# Patient Record
Sex: Female | Born: 1985 | Race: White | Hispanic: No | Marital: Married | Smoking: Never smoker
Health system: Southern US, Community
[De-identification: ages and names within clinical notes are randomized; demographics above are authoritative.]

## PROBLEM LIST (undated history)

## (undated) DIAGNOSIS — G43909 Migraine, unspecified, not intractable, without status migrainosus: Secondary | ICD-10-CM

## (undated) DIAGNOSIS — R519 Headache, unspecified: Secondary | ICD-10-CM

## (undated) DIAGNOSIS — H4710 Unspecified papilledema: Secondary | ICD-10-CM

## (undated) DIAGNOSIS — R51 Headache: Secondary | ICD-10-CM

## (undated) DIAGNOSIS — J45909 Unspecified asthma, uncomplicated: Secondary | ICD-10-CM

## (undated) HISTORY — DX: Migraine, unspecified, not intractable, without status migrainosus: G43.909

## (undated) HISTORY — DX: Unspecified asthma, uncomplicated: J45.909

## (undated) HISTORY — DX: Headache, unspecified: R51.9

## (undated) HISTORY — DX: Unspecified papilledema: H47.10

## (undated) HISTORY — DX: Headache: R51

---
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
## 2009-09-24 ENCOUNTER — Ambulatory Visit: Payer: Self-pay | Admitting: Family

## 2009-09-24 ENCOUNTER — Other Ambulatory Visit: Admission: RE | Admit: 2009-09-24 | Discharge: 2009-09-24 | Payer: Self-pay | Admitting: Internal Medicine

## 2009-09-27 ENCOUNTER — Encounter: Payer: Self-pay | Admitting: Family

## 2009-10-22 ENCOUNTER — Ambulatory Visit: Payer: Self-pay | Admitting: Family

## 2009-12-19 ENCOUNTER — Ambulatory Visit: Payer: Self-pay | Admitting: Family

## 2010-04-11 NOTE — Assessment & Plan Note (Signed)
Summary: TO EST/BIRTH CONTROL/HEA--Rm 4   Vital Signs:  Patient profile:   25 year old female LMP:     09/18/2009 Height:      63 inches Weight:      117 pounds BMI:     20.80 Temp:     97.7 degrees F oral Pulse rate:   72 / minute Pulse rhythm:   regular Resp:     16 per minute BP sitting:   104 / 70  (right arm) Cuff size:   regular  Vitals Entered By: Mervin Kung CMA Duncan Dull) (September 24, 2009 9:33 AM) Is Patient Diabetic? No LMP (date): 09/18/2009     Enter LMP: 09/18/2009   History of Present Illness: Christina Harmon is a 25 year old female who presents today to establish care.  She has not seen a primary care since the age of 65.    Preventative-  Walks daily in her neighborhood.  Diet is generally healthy.  Does not have her shot records.   + sexually active,  one partner in the last 6 months.  Currently using condoms.  Never had a pap smear.  Periods are regular.  Preventive Screening-Counseling & Management  Alcohol-Tobacco     Smoking Status: never  Caffeine-Diet-Exercise     Does Patient Exercise: yes     Type of exercise: walking, running, push up     Exercise (avg: min/session): 30-60     Times/week: <3      Drug Use:  no.    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   Never Smoked Alcohol use-no Drug use-no Regular exercise-yes Does Patient Exercise:  yes Smoking Status:  never Drug Use:  no  Review of Systems       Constitutional: Denies Fever ENT:  Denies nasal congestion or sore throat. Resp: Denies cough CV:  Denies  Chest Pain GI:  Denies nausea or vomitting GU: Denies dysuria Lymphatic: Denies lymphadenopathy Musculoskeletal:  Denies muscle/joint pain Skin:  Denies Rashes Psychiatric: Denies depression Neuro: Denies numbness     Physical Exam  General:  Well-developed,well-nourished,in no acute distress; alert,appropriate and cooperative throughout examination Head:  Normocephalic and atraumatic without obvious abnormalities. No apparent alopecia or balding. Eyes:  PERRLA Ears:  External ear exam shows no significant lesions or deformities.  Otoscopic examination reveals clear canals, tympanic membranes are intact bilaterally without bulging, retraction, inflammation or discharge. Hearing is grossly normal bilaterally. Neck:  No deformities, masses, or tenderness noted. Lungs:  Normal respiratory effort, chest expands symmetrically. Lungs are clear to auscultation, no crackles or wheezes. Heart:  Normal rate and regular rhythm. S1 and S2 normal without gallop, murmur, click, rub or other extra sounds. Abdomen:  Bowel sounds positive,abdomen soft and non-tender without masses, organomegaly or hernias noted. Genitalia:  Pelvic Exam:        External: normal female genitalia without lesions or masses        Vagina: normal without lesions or masses        Cervix: normal without lesions or masses        Adnexa: normal bimanual exam without masses or fullness  Uterus: normal by palpation        Pap smear: performed Msk:  no joint swelling.   Extremities:  No clubbing, cyanosis, edema, or deformity noted with normal full range of motion of all joints.   Neurologic:  No cranial nerve deficits noted. Station and gait are normal. Plantar reflexes are down-going bilaterally. DTRs are symmetrical throughout. Sensory, motor and coordinative functions appear intact. Skin:  Intact without suspicious lesions or rashes Cervical Nodes:  No lymphadenopathy noted Psych:  Cognition and judgment appear intact.  Alert and cooperative with normal attention span and concentration. No apparent delusions, illusions, hallucinations   Impression & Recommendations:  Problem # 1:  Preventive Health Care (ICD-V70.0) Assessment Comment Only Patient encouraged to continue her healthy diet and exercise.  Immunizations reviewed.  Tetanus given today.  Plan follow up in 1 month for Gardisil.  Pap performed today. Will start OCP.  Pt instructed on initiation and use as well as safe sex.  Complete Medication List: 1)  Microgestin Fe 1.5/30 1.5-30 Mg-mcg Tabs (Norethin ace-eth estrad-fe) .... One tablet by mouth daily  Other Orders: Tdap => 83yrs IM ([address]109) Admin 1st Vaccine (60454)  Patient Instructions: 1)  Keep up the good work with the healthy diet and exercise. 2)  We will mail you your Pap smear results. 3)  Please return in 1 month to start your Gardisil series. Prescriptions: MICROGESTIN FE 1.5/30 1.5-30 MG-MCG TABS (NORETHIN ACE-ETH ESTRAD-FE) one tablet by mouth daily  #1 x 11   Entered and Authorized by:   Lemont Fillers FNP   Signed by:   Lemont Fillers FNP on 09/24/2009   Method used:   Electronically to        [facility]  [address] [phone]* (retail)       [address]       [place], [place]  [address]       Ph: [phone] or [phone]       Fax: [phone]   RxID:   [id number]   Current Allergies (reviewed today): No known allergies    Vital Signs:  Patient Profile:   25 year old female LMP:     09/18/2009 Height:     63 inches Weight:      117 pounds BMI:     20.80 Temp:     97.7 degrees F oral Pulse rate:   72 / minute Pulse rhythm:   regular Resp:     16 per minute BP sitting:   104 / 70 Cuff size:   regular                    Immunizations Administered:  Tetanus Vaccine:    Vaccine Type: Tdap    Site: left deltoid    Mfr: GlaxoSmithKline    Dose: 0.5 ml    Route: IM    Given by: Mervin Kung CMA (AAMA)    Exp. Date:  06/01/2011    Lot #: [id number]    Orders Added: 1)  Tdap => 71yrs IM [90715] 2)  Admin 1st Vaccine [90471] 3)  New Patient 18-39 years [99385]

## 2010-04-11 NOTE — Assessment & Plan Note (Signed)
Summary: 1 month follow up/mhf   Vital Signs:  Patient profile:   25 year old female Height:      63 inches (160.02 cm) Weight:      118.50 pounds (53.86 kg) BMI:     21.07 Temp:     97.8 degrees F (36.56 degrees C) oral BP sitting:   102 / 68  (left arm) Cuff size:   regular  Vitals Entered By: Brenton Grills MA (October 22, 2009 9:42 AM) CC: 1 month F/U/aj Is Patient Diabetic? No   CC:  1 month F/U/aj.  History of Present Illness: [name] is a 25 year old female who presents today for follow up.  Last month she was started on Microgestin- reports nausea first  2 days.  Now resolved.  Has had 1 period since started microgestin- which was light.  Denies any breakthrough bleeding.  Wants to have the Gardasil injection today.    Current Medications (verified): 1)  Microgestin Fe 1.5/30 1.5-30 Mg-Mcg Tabs (Norethin Ace-Eth Estrad-Fe) .... One Tablet By Mouth Daily  Allergies (verified): No Known Drug Allergies  Past History:  Past Medical History: Last updated: 09/24/2009 None  Past Surgical History: Last updated: 09/24/2009 Cyst removed right ankle--2002  Family History: Reviewed history from 09/24/2009 and no changes required. Mother-- hypercholestrolemia, hyperthyroidism?, depression Father--HTN Sister--A & W Maternal GM-- A & W Maternal GF-- HTN, hypercholesterolemia, osteoporosis Paternal FM-- Depression, Kidney failure Paternal GF-- A & W  No Children  Social History: Reviewed history from 09/24/2009 and no changes required. Occupation: [employer] with her parents.   Never Smoked Alcohol use-no Drug use-no Regular exercise-yes  Physical Exam  General:  Well-developed,well-nourished,in no acute distress; alert,appropriate and cooperative throughout examination Lungs:  Normal respiratory effort, chest expands symmetrically. Lungs are clear to auscultation, no crackles or wheezes. Heart:  Normal rate and regular rhythm. S1 and S2 normal without  gallop, murmur, click, rub or other extra sounds. Psych:  Cognition and judgment appear intact. Alert and cooperative with normal attention span and concentration. No apparent delusions, illusions, hallucinations   Impression & Recommendations:  Problem # 1:  CONTRACEPTIVE MANAGEMENT (ICD-V25.09) Assessment Unchanged Gardasil given today, patient to f/u in 2 months for a second injection.  Complete Medication List: 1)  Microgestin Fe 1.5/30 1.5-30 Mg-mcg Tabs (Norethin ace-eth estrad-fe) .... One tablet by mouth daily  Other Orders: HPV Vaccine - 3 sched doses - IM (21308) Admin 1st Vaccine (65784)  Patient Instructions: 1)  Follow up in 2 months for a nurse visit to receive your second Gardasil injection.     Immunizations Administered:  HPV # 1:    Vaccine Type: Gardasil    Site: left deltoid    Mfr: Merck    Dose: 0.5 ml    Route: IM    Given by: Brenton Grills MA    Exp. Date: 06/16/2011    Lot #: [id number]    VIS given: 04/11/05 version given October 22, 2009.

## 2010-04-11 NOTE — Assessment & Plan Note (Signed)
Summary: GARDISIL SHOT/MHF  Nurse Visit   Allergies: No Known Drug Allergies  Immunizations Administered:  HPV # 2:    Vaccine Type: Gardasil    Site: left deltoid    Mfr: Merck    Dose: 0.5 ml    Route: IM    Given by: Glendell Docker CMA    Exp. Date: 06/16/2011    Lot #: [id number]    VIS given: 07/10/09 version given December 19, 2009.  Influenza Vaccine # 1:    Vaccine Type: Fluvax 3+    Site: right deltoid    Mfr: GlaxoSmithKline    Dose: 0.5 ml    Route: IM    Given by: Glendell Docker CMA    Exp. Date: 09/07/2010    Lot #: [id number]    VIS given: 10/02/09 version given December 19, 2009.  Flu Vaccine Consent Questions:    Do you have a history of severe allergic reactions to this vaccine? no    Any prior history of allergic reactions to egg and/or gelatin? no    Do you have a sensitivity to the preservative Thimersol? no    Do you have a past history of Guillan-Barre Syndrome? no    Do you currently have an acute febrile illness? no    Have you ever had a severe reaction to latex? no    Vaccine information given and explained to patient? yes    Are you currently pregnant? no  Orders Added: 1)  HPV Vaccine - 3 sched doses - IM [90649] 2)  Admin 1st Vaccine [90471] 3)  Flu Vaccine 18yrs + [65784] 4)  Admin of Any Addtl Vaccine [69629]

## 2010-04-11 NOTE — Letter (Signed)
   [place] [facility] [address] [place], [place]  [address]  [place] Phone: [phone]      September 27, 2009   Christina Harmon [address] [place], [place] [address]  RE:  LAB RESULTS  Dear  [name],  The following is an interpretation of your most recent lab tests.  Please take note of any instructions provided or changes to medications that have resulted from your lab work.  Pap Smear: normal   Sincerely Yours,    Lemont Fillers FNP  Appended Document:  Mailed.

## 2010-07-17 ENCOUNTER — Encounter: Payer: Self-pay | Admitting: Family

## 2010-07-23 ENCOUNTER — Encounter: Payer: Self-pay | Admitting: Family

## 2010-07-26 NOTE — Op Note (Signed)
[place]  Patient:    Christina Harmon, Christina Harmon                      MRN: [id number] Proc. Date: 02/25/00 Adm. Date:  [id number] Attending:  Fayette Pho Damodar CC:         Wilber Bihari, M.D.   Operative Report  PREOPERATIVE DIAGNOSIS:  Cyst of the right leg.  POSTOPERATIVE DIAGNOSIS:  Cyst of the right leg.  PROCEDURE:  Excision of cyst of right leg.  SURGEON:  Prabhakar D. Levie Heritage, M.D.  ASSISTANT:  Nurse.  ANESTHESIA:  Nurse.  DESCRIPTION OF PROCEDURE:  Under satisfactory general anesthesia, the patient was in supine position.  Right leg region was thoroughly prepped and draped in the usual manner.  Previously-placed pneumatic tourniquet was set to 150 mm of pressure, and a 1.5 cm long transverse incision was made directly over the cystic mass.  Skin and subcutaneous tissue incised.  Bleeders individually clamped, cut, and electrocoagulated.  By blunt and sharp dissection, the mass was separated from the surrounding structures.  There were some adhesions due to inflammatory reactions of the past.  Further dissection showed that the mass appeared to be more solid than cystic in nature and perhaps connected to the deeper veins, and this could have represented a traumatic aneurysm of the vein due to previous injury and/or varicosity of the vein.  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  After complete dissection, there was a small vascular channel connected to the cyst, which was repaired with 6-0 silk interrupted sutures.  There were some inflammatory lymph node-like structures included into the mass, which during the dissection was cut through.  I did not attempt to excise the remainder of the lymph node-like structure.  After complete excision of the mass, the area was irrigated.  The deeper layers approximated with 4-0 Vicryl interrupted suture.  Skin closed with 5-0 nylon subcuticular suture.  Steri-Strips applied.   Appropriate dressing applied.  Throughout the procedure, patients vital signs remained stable.  Patient withstood the procedure well and was transferred to the recovery room in satisfactory general condition. DD:  02/25/00 TD:  02/26/00 Job: [id number]

## 2010-08-22 ENCOUNTER — Telehealth: Payer: Self-pay | Admitting: Family

## 2010-08-22 NOTE — Telephone Encounter (Signed)
REFILL JUNEL FE  1.5 MG 30 MCG TABLET QTY 28 ONE BY MOUTH DAILY LST FILL 07-25-10

## 2010-08-22 NOTE — Telephone Encounter (Signed)
Pt has no future appt on file. Last seen 10/2009. Please advise if ok to refill and if so how many refills?

## 2010-08-23 MED ORDER — NORETHIN ACE-ETH ESTRAD-FE 1.5-30 MG-MCG PO TABS: 1.0000 | ORAL_TABLET | Freq: Every day | ORAL | Status: DC

## 2010-08-23 NOTE — Telephone Encounter (Signed)
Refills sent to pharmacy for Junel per instructions below.

## 2010-08-23 NOTE — Telephone Encounter (Signed)
Ok to give 1 month with 6 refills.

## 2010-11-18 ENCOUNTER — Encounter: Payer: Self-pay | Admitting: Family

## 2010-11-18 ENCOUNTER — Ambulatory Visit (INDEPENDENT_AMBULATORY_CARE_PROVIDER_SITE_OTHER): Payer: BC Managed Care – PPO | Admitting: Family

## 2010-11-18 DIAGNOSIS — Z0000 Encounter for general adult medical examination without abnormal findings: Secondary | ICD-10-CM | POA: Insufficient documentation

## 2010-11-18 DIAGNOSIS — Z23 Encounter for immunization: Secondary | ICD-10-CM

## 2010-11-18 DIAGNOSIS — G43909 Migraine, unspecified, not intractable, without status migrainosus: Secondary | ICD-10-CM | POA: Insufficient documentation

## 2010-11-18 LAB — CBC
HCT: 44.8 % (ref 36.0–46.0)
Hemoglobin: 14.7 g/dL (ref 12.0–15.0)
MCH: 29.8 pg (ref 26.0–34.0)
MCV: 90.7 fL (ref 78.0–100.0)
RBC: 4.94 MIL/uL (ref 3.87–5.11)
WBC: 5.8 10*3/uL (ref 4.0–10.5)

## 2010-11-18 MED ORDER — TOPIRAMATE 25 MG PO TABS: ORAL_TABLET | ORAL | Status: DC

## 2010-11-18 MED ORDER — SUMATRIPTAN SUCCINATE 50 MG PO TABS: 50.0000 mg | ORAL_TABLET | Freq: Once | ORAL | Status: DC | PRN

## 2010-11-18 NOTE — Progress Notes (Signed)
Addended by: Mervin Kung A on: 11/18/2010 05:00 PM   Modules accepted: Orders

## 2010-11-18 NOTE — Patient Instructions (Signed)
Please follow up in 1 month. Complete fasting lab work on the first floor.

## 2010-11-18 NOTE — Assessment & Plan Note (Signed)
Symptoms consistent with migraine HA's.  She notes + family hx of migraine (dad).  Will give her a trial of topamax for prevention of migraine and imitrex to be used as needed.

## 2010-11-18 NOTE — Assessment & Plan Note (Signed)
Pt was counseled on diet, exercise and weight loss.  She has gained nearly 20 pounds since her last visit.  Will check fasting laboratories to include TSH due to weight gain.  3rd gardasil and flu shot given today.  Form to be filled for work.  She has requested that we fax the form to number on sheet.  She told me that she had last pap in July.  After I reviewed record more closely, I see that she had pap 7/11.   She is to follow up in 1 month for follow up and we will plan to complete at that time.

## 2010-11-18 NOTE — Progress Notes (Signed)
Subjective:    Patient ID: Christina Harmon, female    DOB: 09-Aug-1985, 25 y.o.   MRN: [id number]  HPI  Christina Harmon is a 25 yr old female who presents today for her annual physical.     Preventative- not exercising regularly.  She used to walk regularly.  Now she is not walking.  Last pap smear was last July.  She is due for her 3rd gardisil injection.  HA's she has been having every day.  Some days she has associated photosensitivity and states that they become severe.  Other days, she notes improvement with the use of ibuprofen.   Review of Systems  Constitutional: Positive for unexpected weight change.  Respiratory: Negative for shortness of breath.   Cardiovascular: Negative for chest pain.  Genitourinary: Negative for dysuria and frequency.  Musculoskeletal: Negative for joint swelling and arthralgias.  Skin: Negative for rash.  Neurological: Positive for headaches.  Psychiatric/Behavioral:       Denies depression or anxiety   No past medical history on file.  History   Social History  . Marital Status: Married    Spouse Name: N/A    Number of Children: N/A  . Years of Education: N/A   Occupational History  . Not on file.   Social History Main Topics  . Smoking status: Never Smoker   . Smokeless tobacco: Never Used  . Alcohol Use: No  . Drug Use: No  . Sexually Active: Not on file   Other Topics Concern  . Not on file   Social History Narrative   Caffeine use:  OccasionalRegular exercise:  No    Past Surgical History  Procedure Date  . Ankle surgery 2002    cyst removed from right ankle    Family History  Problem Relation Age of Onset  . Depression Mother   . Hypothyroidism Mother   . Hypertension Father   . COPD Maternal Grandfather   . Stroke Maternal Grandfather     mini strokes  . Stroke Paternal Grandmother     mini strokes  . Cancer Other     leukemia--maternal great aunt    Allergies not on file  Current Outpatient Prescriptions on  File Prior to Visit  Medication Sig Dispense Refill  . norethindrone-ethinyl estradiol-iron (JUNEL FE 1.5/30) 1.5-30 MG-MCG tablet Take 1 tablet by mouth daily.  30 tablet  6    BP 100/70  Pulse 72  Temp(Src) 97.9 F (36.6 C) (Oral)  Resp 16  Ht 5\' 3"  (1.6 m)  Wt 137 lb 1.9 oz (62.197 kg)  BMI 24.29 kg/m2        Objective:   Physical Exam  Constitutional: She is oriented to person, place, and time. She appears well-developed and well-nourished.  HENT:  Head: Normocephalic and atraumatic.  Right Ear: Tympanic membrane and ear canal normal.  Left Ear: Tympanic membrane and ear canal normal.  Mouth/Throat: Uvula is midline, oropharynx is clear and moist and mucous membranes are normal.  Eyes: Conjunctivae are normal.  Neck: Neck supple. No thyromegaly present.  Cardiovascular: Normal rate and regular rhythm.   No murmur heard. Pulmonary/Chest: Effort normal and breath sounds normal.  Abdominal: Soft. Bowel sounds are normal. She exhibits no distension. There is no tenderness. There is no rebound and no guarding.  Musculoskeletal: Normal range of motion.  Neurological: She is alert and oriented to person, place, and time.  Skin: Skin is warm and dry.  Psychiatric: She has a normal mood and affect. Her behavior  is normal. Judgment and thought content normal.          Assessment & Plan:

## 2010-11-19 ENCOUNTER — Encounter: Payer: Self-pay | Admitting: Family

## 2010-11-19 LAB — HEPATIC FUNCTION PANEL
ALT: 11 U/L (ref 0–35)
AST: 17 U/L (ref 0–37)
Alkaline Phosphatase: 40 U/L (ref 39–117)
Indirect Bilirubin: 0.4 mg/dL (ref 0.0–0.9)
Total Protein: 7.5 g/dL (ref 6.0–8.3)

## 2010-11-19 LAB — BASIC METABOLIC PANEL WITH GFR
Chloride: 102 mEq/L (ref 96–112)
Creat: 0.7 mg/dL (ref 0.50–1.10)
GFR, Est Non African American: >60 mL/min (ref >60)
Sodium: 139 mEq/L (ref 135–145)

## 2010-11-19 LAB — LIPID PANEL
LDL Cholesterol: 105 mg/dL — ABNORMAL HIGH (ref 0–99)
Triglycerides: 74 mg/dL (ref <150)
VLDL: 15 mg/dL (ref 0–40)

## 2011-02-18 ENCOUNTER — Other Ambulatory Visit: Payer: Self-pay | Admitting: Family

## 2011-02-18 NOTE — Telephone Encounter (Signed)
Left message for patient to return my call.

## 2011-02-18 NOTE — Telephone Encounter (Signed)
Refills sent to pharmacy for topamax and imitrex 1 month supply x 1 refill each.. Pt is due for pap smear. Please call pt to arrange

## 2011-02-18 NOTE — Telephone Encounter (Signed)
Patient returned my phone call and said that she will have to call back to make appt.

## 2011-03-07 ENCOUNTER — Other Ambulatory Visit: Payer: Self-pay | Admitting: Family

## 2011-03-24 ENCOUNTER — Other Ambulatory Visit (HOSPITAL_COMMUNITY)
Admission: RE | Admit: 2011-03-24 | Discharge: 2011-03-24 | Disposition: A | Discharge status: Home / Self Care | Payer: BC Managed Care – PPO | Source: Ambulatory Visit | Attending: Family | Admitting: Family

## 2011-03-24 ENCOUNTER — Encounter: Payer: Self-pay | Admitting: Family

## 2011-03-24 ENCOUNTER — Ambulatory Visit (INDEPENDENT_AMBULATORY_CARE_PROVIDER_SITE_OTHER): Payer: BC Managed Care – PPO | Admitting: Family

## 2011-03-24 VITALS — BP 130/80 | HR 78 | Temp 97.8°F | Resp 16 | Ht 63.0 in | Wt 139.0 lb

## 2011-03-24 DIAGNOSIS — Z01419 Encounter for gynecological examination (general) (routine) without abnormal findings: Secondary | ICD-10-CM | POA: Insufficient documentation

## 2011-03-24 MED ORDER — CALCIUM CARBONATE-VITAMIN D 600-400 MG-UNIT PO TABS: 1.0000 | ORAL_TABLET | Freq: Two times a day (BID) | ORAL | Status: DC

## 2011-03-24 NOTE — Patient Instructions (Signed)
You will be contacted about the results of your pap smear.

## 2011-03-24 NOTE — Assessment & Plan Note (Signed)
Pap performed today. Pt counseled on SBE.  Add caltrate for bone health.

## 2011-03-24 NOTE — Progress Notes (Signed)
  Subjective:    Patient ID: REETA KUK, female    DOB: 06/26/1985, 26 y.o.   MRN: [id number]  HPI  Ms.  Wigen is a 26 yr old female who presents today for her Pap smear.  She has had 1 pap smear in the past which she reports was normal.  Reports periods are normal- last 5-7 days.  She reports that her period is regular.  Denies significant cramping.  1 partner last 6 months.  She denies abnormal vaginal discharge.   Review of Systems See HPI  No past medical history on file.  History   Social History  . Marital Status: Married    Spouse Name: N/A    Number of Children: N/A  . Years of Education: N/A   Occupational History  . Not on file.   Social History Main Topics  . Smoking status: Never Smoker   . Smokeless tobacco: Never Used  . Alcohol Use: No  . Drug Use: No  . Sexually Active: Not on file   Other Topics Concern  . Not on file   Social History Narrative   Caffeine use:  OccasionalRegular exercise:  No    Past Surgical History  Procedure Date  . Ankle surgery 2002    cyst removed from right ankle    Family History  Problem Relation Age of Onset  . Depression Mother   . Hypothyroidism Mother   . Hypertension Father   . COPD Maternal Grandfather   . Stroke Maternal Grandfather     mini strokes  . Stroke Paternal Grandmother     mini strokes  . Cancer Other     leukemia--maternal great aunt    No Known Allergies  Current Outpatient Prescriptions on File Prior to Visit  Medication Sig Dispense Refill  . JUNEL FE 1.5/30 1.5-30 MG-MCG tablet TAKE 1 TABLET BY MOUTH DAILY.  28 tablet  1  . SUMAtriptan (IMITREX) 50 MG tablet TAKE 1 TABLET (50 MG TOTAL) BY MOUTH ONCE AS NEEDED FOR MIGRAINE.  6 tablet  1  . topiramate (TOPAMAX) 25 MG tablet TAKE 1 TABLET AT BEDTIME  30 tablet  1    BP 130/80  Pulse 78  Temp(Src) 97.8 F (36.6 C) (Oral)  Resp 16  Ht 5\' 3"  (1.6 m)  Wt 139 lb 0.6 oz (63.068 kg)  BMI 24.63 kg/m2  LMP 03/23/2011         Objective:   Physical Exam  Constitutional: She is oriented to person, place, and time. She appears well-developed and well-nourished. No distress.  Cardiovascular: Normal rate and regular rhythm.   No murmur heard. Pulmonary/Chest: Effort normal and breath sounds normal. No respiratory distress. She has no wheezes. She has no rales. She exhibits no tenderness.  Genitourinary:       Breasts: Examined lying.  Right: Without masses, retractions, discharge or axillary adenopathy.  Left: Without masses, retractions, discharge or axillary adenopathy.  Inguinal/mons: Normal without inguinal adenopathy  External genitalia: Normal  BUS/Urethra/Skene's glands: Normal  Bladder: Normal  Vagina: Normal  Cervix: Normal, Pap performed Uterus: normal in size, shape and contour. Midline and mobile  Adnexa/parametria:  Rt: Without masses or tenderness.  Lt: Without masses or tenderness.  Anus and perineum: Normal    Musculoskeletal: She exhibits no edema.  Neurological: She is alert and oriented to person, place, and time.          Assessment & Plan:

## 2011-03-28 ENCOUNTER — Encounter: Payer: Self-pay | Admitting: Family

## 2011-05-02 ENCOUNTER — Other Ambulatory Visit: Payer: Self-pay | Admitting: Family

## 2011-05-31 ENCOUNTER — Other Ambulatory Visit: Payer: Self-pay | Admitting: Family

## 2011-07-25 ENCOUNTER — Telehealth: Payer: Self-pay | Admitting: Family

## 2011-07-25 MED ORDER — NORETHIN ACE-ETH ESTRAD-FE 1.5-30 MG-MCG PO TABS: 1.0000 | ORAL_TABLET | Freq: Every day | ORAL | Status: DC

## 2011-07-25 NOTE — Telephone Encounter (Signed)
Refill- gildess fe 1.5-30 tablet. Take one tablet by mouth daily. Qty 28 last fill 4.19.13

## 2011-07-25 NOTE — Telephone Encounter (Signed)
Rx refill sent to pharmacy. 

## 2011-08-24 ENCOUNTER — Other Ambulatory Visit: Payer: Self-pay | Admitting: Family

## 2011-09-30 ENCOUNTER — Ambulatory Visit (INDEPENDENT_AMBULATORY_CARE_PROVIDER_SITE_OTHER): Payer: BC Managed Care – PPO | Admitting: Family

## 2011-09-30 ENCOUNTER — Encounter: Payer: Self-pay | Admitting: Family

## 2011-09-30 VITALS — BP 130/84 | HR 84 | Temp 97.8°F | Resp 20 | Ht 63.0 in | Wt 147.0 lb

## 2011-09-30 DIAGNOSIS — M545 Low back pain: Secondary | ICD-10-CM

## 2011-09-30 DIAGNOSIS — M549 Dorsalgia, unspecified: Secondary | ICD-10-CM

## 2011-09-30 DIAGNOSIS — M79604 Pain in right leg: Secondary | ICD-10-CM

## 2011-09-30 MED ORDER — MELOXICAM 7.5 MG PO TABS: 7.5000 mg | ORAL_TABLET | Freq: Every day | ORAL | Status: AC

## 2011-09-30 NOTE — Patient Instructions (Addendum)
Please schedule a follow up appointment in 1 month. You will be contacted about your referral to physical therapy.

## 2011-09-30 NOTE — Progress Notes (Signed)
Subjective:    Patient ID: Christina Harmon, female    DOB: 17-Jan-1986, 27 y.o.   MRN: [id number]  HPI  [name] is a 26 yr old female who presents today with chief complaint of back pain.  Pain has been present for 8 months but has worsened the last 6 weeks.  Pain is located in the left lower back and radiates down the left leg.   Reports that her left leg will "give out" while standing at work.She has associated low back spasms.  She saw urgent care last Monday- told that it looked ok.  She reports that she took prednisone and a muscle relaxer- robaxin.  She reports that pred helped until she finished the dose pak and then the pain returned.  Review of Systems See HPI  No past medical history on file.  History   Social History  . Marital Status: Married    Spouse Name: N/A    Number of Children: N/A  . Years of Education: N/A   Occupational History  . Not on file.   Social History Main Topics  . Smoking status: Never Smoker   . Smokeless tobacco: Never Used  . Alcohol Use: No  . Drug Use: No  . Sexually Active: Not on file   Other Topics Concern  . Not on file   Social History Narrative   Caffeine use:  OccasionalRegular exercise:  No    Past Surgical History  Procedure Date  . Ankle surgery 2002    cyst removed from right ankle    Family History  Problem Relation Age of Onset  . Depression Mother   . Hypothyroidism Mother   . Hypertension Father   . COPD Maternal Grandfather   . Stroke Maternal Grandfather     mini strokes  . Stroke Paternal Grandmother     mini strokes  . Cancer Other     leukemia--maternal great aunt    No Known Allergies  Current Outpatient Prescriptions on File Prior to Visit  Medication Sig Dispense Refill  . Calcium Carbonate-Vitamin D (CALTRATE 600+D) 600-400 MG-UNIT per tablet Take 1 tablet by mouth 2 (two) times daily.      . norethindrone-ethinyl estradiol-iron (JUNEL FE 1.5/30) 1.5-30 MG-MCG tablet Take 1 tablet by mouth  daily.  28 tablet  2  . SUMAtriptan (IMITREX) 50 MG tablet TAKE 1 TABLET (50 MG TOTAL) BY MOUTH ONCE AS NEEDED FOR MIGRAINE.  6 tablet  1  . topiramate (TOPAMAX) 25 MG tablet TAKE 1 TABLET AT BEDTIME  30 tablet  2    BP 130/84  Pulse 84  Temp 97.8 F (36.6 C) (Oral)  Resp 20  Ht 5\' 3"  (1.6 m)  Wt 147 lb (66.679 kg)  BMI 26.04 kg/m2       Objective:   Physical Exam  Constitutional: She is oriented to person, place, and time. She appears well-developed and well-nourished. No distress.  Cardiovascular: Normal rate and regular rhythm.   No murmur heard. Pulmonary/Chest: Effort normal and breath sounds normal. No respiratory distress. She has no wheezes. She has no rales. She exhibits no tenderness.  Neurological: She is alert and oriented to person, place, and time.  Reflex Scores:      Patellar reflexes are 3+ on the right side and 3+ on the left side.      Achilles reflexes are 2+ on the right side and 2+ on the left side.      + straight leg raise on left. Able  to toe walk/heel walk.  Bilateral LE strength is 5/5.    Psychiatric: She has a normal mood and affect. Her behavior is normal. Judgment and thought content normal.          Assessment & Plan:

## 2011-09-30 NOTE — Assessment & Plan Note (Signed)
Pt brings with her today back x-ray images from the urgent care on CD.  I have reviewed these images.  I do not see any gross abnormalities, but I did tell Christina Harmon and her mom that I am not a radiologist and generally do not read spinal films.  At this point, I think that it is would be best to start some physical therapy and continue NSAIDS for the shortest course necessary.  I have asked her to follow up in 1 month.  If her symptoms worsen or if no improvement, plan referral for MRI of the spine. Pt agrees to plan.

## 2011-10-16 ENCOUNTER — Telehealth: Payer: Self-pay | Admitting: Family

## 2011-10-16 MED ORDER — NORETHIN ACE-ETH ESTRAD-FE 1.5-30 MG-MCG PO TABS: 1.0000 | ORAL_TABLET | Freq: Every day | ORAL | Status: DC

## 2011-10-16 NOTE — Telephone Encounter (Signed)
Refill sent to pharmacy #28 x 2 refills.

## 2011-10-16 NOTE — Telephone Encounter (Signed)
Refill- gildess FE 1.5-30 tablet. Take one tablet by mouth daily. Qty 28 last fill 7.12.13

## 2011-10-27 ENCOUNTER — Telehealth: Payer: Self-pay | Admitting: Family

## 2011-10-27 MED ORDER — TOPIRAMATE 25 MG PO TABS: 25.0000 mg | ORAL_TABLET | Freq: Every day | ORAL | Status: DC

## 2011-10-27 NOTE — Telephone Encounter (Signed)
[facility] 10/27/11@3 :25pm/LMB

## 2011-10-27 NOTE — Telephone Encounter (Signed)
Refill-topiramate 25mg  tablet. Take one tablet at bedtime. Qty 30 date written 3.25.13

## 2011-10-29 ENCOUNTER — Encounter: Payer: Self-pay | Admitting: Family

## 2011-10-29 ENCOUNTER — Ambulatory Visit (INDEPENDENT_AMBULATORY_CARE_PROVIDER_SITE_OTHER): Payer: BC Managed Care – PPO | Admitting: Family

## 2011-10-29 VITALS — BP 116/88 | HR 84 | Temp 98.7°F | Resp 14 | Wt 149.8 lb

## 2011-10-29 DIAGNOSIS — M545 Low back pain: Secondary | ICD-10-CM

## 2011-10-29 NOTE — Progress Notes (Signed)
  Subjective:    Patient ID: Christina Harmon, female    DOB: [birth year], 26 y.o.   MRN: [id number]  HPI  Ms.  Dohmen is a 26 yr old female who presents today for follow up of her back pain.  She has been participating in physical therapy.  Reports that her back is doing " a lot better.  She is off of meloxicam now.  She has another appointment next week. Still trying to take it easy at work and not do any heavy lifting.  Review of Systems See HPI  No past medical history on file.  History   Social History  . Marital Status: Married    Spouse Name: N/A    Number of Children: N/A  . Years of Education: N/A   Occupational History  . Not on file.   Social History Main Topics  . Smoking status: Never Smoker   . Smokeless tobacco: Never Used  . Alcohol Use: No  . Drug Use: No  . Sexually Active: Not on file   Other Topics Concern  . Not on file   Social History Narrative   Caffeine use:  OccasionalRegular exercise:  No    Past Surgical History  Procedure Date  . Ankle surgery 2002    cyst removed from right ankle    Family History  Problem Relation Age of Onset  . Depression Mother   . Hypothyroidism Mother   . Hypertension Father   . COPD Maternal Grandfather   . Stroke Maternal Grandfather     mini strokes  . Stroke Paternal Grandmother     mini strokes  . Cancer Other     leukemia--maternal great aunt    No Known Allergies  Current Outpatient Prescriptions on File Prior to Visit  Medication Sig Dispense Refill  . Calcium Carbonate-Vitamin D (CALTRATE 600+D) 600-400 MG-UNIT per tablet Take 1 tablet by mouth 2 (two) times daily.      . meloxicam (MOBIC) 7.5 MG tablet Take 1 tablet (7.5 mg total) by mouth daily.  20 tablet  0  . norethindrone-ethinyl estradiol-iron (JUNEL FE 1.5/30) 1.5-30 MG-MCG tablet Take 1 tablet by mouth daily.  28 tablet  2  . SUMAtriptan (IMITREX) 50 MG tablet TAKE 1 TABLET (50 MG TOTAL) BY MOUTH ONCE AS NEEDED FOR MIGRAINE.  6 tablet   1  . topiramate (TOPAMAX) 25 MG tablet Take 1 tablet (25 mg total) by mouth at bedtime.  30 tablet  2    BP 116/88  Pulse 84  Temp 98.7 F (37.1 C) (Oral)  Resp 14  Wt 149 lb 12 oz (67.926 kg)  SpO2 99%  LMP 10/22/2011       Objective:   Physical Exam  Constitutional: She appears well-developed and well-nourished. No distress.  Cardiovascular: Normal rate and regular rhythm.   No murmur heard. Pulmonary/Chest: Effort normal and breath sounds normal. No respiratory distress. She has no wheezes. She has no rales. She exhibits no tenderness.  Neurological:  Reflex Scores:      Patellar reflexes are 2+ on the right side and 3+ on the left side.      Bilateral LE strength is 5/5          Assessment & Plan:

## 2011-10-29 NOTE — Patient Instructions (Addendum)
Follow up this fall for a physical.

## 2011-10-29 NOTE — Assessment & Plan Note (Signed)
Improved.  Recommended that pt avoid heavy lifting for another 2 weeks.  Continue PT.

## 2011-12-13 ENCOUNTER — Other Ambulatory Visit: Payer: Self-pay | Admitting: Family

## 2012-01-10 ENCOUNTER — Other Ambulatory Visit: Payer: Self-pay | Admitting: Family

## 2012-01-13 ENCOUNTER — Other Ambulatory Visit: Payer: Self-pay | Admitting: Family

## 2012-01-14 ENCOUNTER — Telehealth: Payer: Self-pay | Admitting: Family

## 2012-01-14 ENCOUNTER — Encounter: Payer: Self-pay | Admitting: Family

## 2012-01-14 NOTE — Telephone Encounter (Signed)
Left message on [facility] voicemail to check refill on file from 12/13/11, #6 x 1 refill and call if any questions.

## 2012-01-14 NOTE — Telephone Encounter (Signed)
Refill- sumatriptan succ 50mg  tablet. Take one tablet(50mg  total) by mouth once as needed for migraine. Qty 6 last fill 9.8.13

## 2012-04-06 ENCOUNTER — Other Ambulatory Visit: Payer: Self-pay | Admitting: Family

## 2012-06-28 ENCOUNTER — Other Ambulatory Visit: Payer: Self-pay | Admitting: Family

## 2012-07-09 ENCOUNTER — Other Ambulatory Visit: Payer: Self-pay | Admitting: Family

## 2012-07-12 ENCOUNTER — Other Ambulatory Visit: Payer: Self-pay | Admitting: Family

## 2012-07-13 NOTE — Telephone Encounter (Signed)
DENIED--Last Rx 05.02.14 #6x1; verified w/pharmacy, pt filled on 05.03.14/SLS

## 2012-09-29 ENCOUNTER — Other Ambulatory Visit: Payer: Self-pay | Admitting: Family

## 2012-09-30 NOTE — Telephone Encounter (Signed)
Left message for patient to return my call.

## 2012-09-30 NOTE — Telephone Encounter (Signed)
Junel refill sent to pharmacy. Pt is due for a physical.  Please call pt to arrange.

## 2012-10-01 MED ORDER — TOPIRAMATE 25 MG PO TABS: 25.0000 mg | ORAL_TABLET | Freq: Every day | ORAL | Status: DC

## 2012-10-01 NOTE — Telephone Encounter (Signed)
I do not see any refill requests from pt's pharmacy for topamax refill.  Refill sent.

## 2012-10-01 NOTE — Telephone Encounter (Signed)
Informed patient of medication refill and she states that she will have to call back to schedule cpe.  Also, patient states that she has been having a hard time filling her topramax prescription. She says that the pharmacy told her that she needed to contact our office.

## 2012-10-01 NOTE — Telephone Encounter (Signed)
Left message for patient to return my call.

## 2012-10-28 ENCOUNTER — Other Ambulatory Visit: Payer: Self-pay | Admitting: Family

## 2012-12-01 ENCOUNTER — Other Ambulatory Visit: Payer: Self-pay | Admitting: Family

## 2012-12-01 NOTE — Telephone Encounter (Signed)
Pharmacy sent refill request for junel. Denial sent to pharmacy with note to use previous refill from 10/28/12, #28 x 2 refills. Pt was due for fasting physical in August and is past due. Please advise pt we will not be able to provide further refills on medications until she is seen.

## 2012-12-02 NOTE — Telephone Encounter (Signed)
Patient scheduled cpe for next week. She states that the pharmacy told her that she did not have the two refills left and to contact our office. She even stated that she thought she had two refills left from the 10/28/12 refill.

## 2012-12-02 NOTE — Telephone Encounter (Signed)
Left detailed message on pharmacy voicemail to update 10/28/12 Rx and please add the 2 refills that were authorized at that time and to call if any questions.

## 2012-12-02 NOTE — Telephone Encounter (Signed)
Left message for patient to return my call.

## 2012-12-08 ENCOUNTER — Ambulatory Visit (INDEPENDENT_AMBULATORY_CARE_PROVIDER_SITE_OTHER): Payer: BC Managed Care – PPO | Admitting: Family

## 2012-12-08 ENCOUNTER — Encounter: Payer: Self-pay | Admitting: Family

## 2012-12-08 VITALS — BP 140/94 | HR 88 | Temp 98.4°F | Resp 16 | Ht 63.0 in | Wt 161.1 lb

## 2012-12-08 DIAGNOSIS — Z0000 Encounter for general adult medical examination without abnormal findings: Secondary | ICD-10-CM

## 2012-12-08 DIAGNOSIS — Z23 Encounter for immunization: Secondary | ICD-10-CM

## 2012-12-08 DIAGNOSIS — Z01419 Encounter for gynecological examination (general) (routine) without abnormal findings: Secondary | ICD-10-CM

## 2012-12-08 LAB — BASIC METABOLIC PANEL
BUN: 10 mg/dL (ref 6–23)
CO2: 27 mEq/L (ref 19–32)
Chloride: 101 mEq/L (ref 96–112)
Creat: 0.79 mg/dL (ref 0.50–1.10)
Glucose, Bld: 78 mg/dL (ref 70–99)
Potassium: 3.8 mEq/L (ref 3.5–5.3)

## 2012-12-08 LAB — CBC WITH DIFFERENTIAL/PLATELET
Basophils Relative: 1 % (ref 0–1)
Eosinophils Absolute: 0.1 10*3/uL (ref 0.0–0.7)
Eosinophils Relative: 2 % (ref 0–5)
Lymphs Abs: 2.6 10*3/uL (ref 0.7–4.0)
MCH: 29.4 pg (ref 26.0–34.0)
MCHC: 34.3 g/dL (ref 30.0–36.0)
MCV: 85.5 fL (ref 78.0–100.0)
Monocytes Relative: 7 % (ref 3–12)
Neutrophils Relative %: 61 % (ref 43–77)
Platelets: 344 10*3/uL (ref 150–400)

## 2012-12-08 LAB — HEPATIC FUNCTION PANEL
AST: 37 U/L (ref 0–37)
Albumin: 4.6 g/dL (ref 3.5–5.2)
Alkaline Phosphatase: 53 U/L (ref 39–117)
Total Bilirubin: 0.3 mg/dL (ref 0.3–1.2)
Total Protein: 6.9 g/dL (ref 6.0–8.3)

## 2012-12-08 LAB — LIPID PANEL
Cholesterol: 195 mg/dL (ref 0–200)
Total CHOL/HDL Ratio: 3 Ratio

## 2012-12-08 NOTE — Patient Instructions (Addendum)
Please complete lab work prior to leaving. Follow up in 2 weeks. Work on a low sodium diet.

## 2012-12-08 NOTE — Progress Notes (Signed)
Subjective:    Patient ID: Christina Harmon, female    DOB: 01/19/1986, 27 y.o.   MRN: [id number]  HPI  Patient presents today for complete physical.  Immunizations: flu shot today, tetanus up to date. Diet: reports a lot of fast foods, often has smoothies for breakfast with yogurt. Drinks sodas.  Exercise: not regular. Pap Smear: January 2013   Review of Systems  Constitutional: Positive for unexpected weight change.  HENT: Positive for rhinorrhea. Negative for hearing Harmon and congestion.   Eyes: Negative for visual disturbance.  Respiratory: Negative for cough and shortness of breath.   Cardiovascular: Negative for chest pain.  Gastrointestinal: Negative for nausea, diarrhea and constipation.  Endocrine: Positive for heat intolerance.  Genitourinary: Negative for dysuria and frequency.       She did report 9 week stretch without periods  Musculoskeletal: Negative for myalgias and arthralgias.  Neurological:       Headaches 4x a week, at times she has migraines- overall improved.  She continues topamax.  Uses imitrex PRN  Hematological: Negative for adenopathy.  Psychiatric/Behavioral:       Denies depression/anxiety   History reviewed. No pertinent past medical history.  History   Social History  . Marital Status: Married    Spouse Name: N/A    Number of Children: N/A  . Years of Education: N/A   Occupational History  . Not on file.   Social History Main Topics  . Smoking status: Never Smoker   . Smokeless tobacco: Never Used  . Alcohol Use: No  . Drug Use: No  . Sexual Activity: Not on file   Other Topics Concern  . Not on file   Social History Narrative   Caffeine use:  Occasional   Regular exercise:  No          Past Surgical History  Procedure Laterality Date  . Ankle surgery  2002    cyst removed from right ankle    Family History  Problem Relation Age of Onset  . Depression Mother   . Hypothyroidism Mother   . Hypertension Father   .  COPD Maternal Grandfather   . Stroke Maternal Grandfather     mini strokes  . Stroke Paternal Grandmother     mini strokes  . Cancer Other     leukemia--maternal great aunt    No Known Allergies  Current Outpatient Prescriptions on File Prior to Visit  Medication Sig Dispense Refill  . Calcium Carbonate-Vitamin D (CALTRATE 600+D) 600-400 MG-UNIT per tablet Take 1 tablet by mouth 2 (two) times daily.      Colleen Can FE 1.5/30 1.5-30 MG-MCG tablet TAKE 1 TABLET BY MOUTH DAILY.  28 tablet  2  . SUMAtriptan (IMITREX) 50 MG tablet TAKE 1 TABLET (50 MG TOTAL) BY MOUTH ONCE AS NEEDED FOR MIGRAINE.  6 tablet  1  . topiramate (TOPAMAX) 25 MG tablet Take 1 tablet (25 mg total) by mouth at bedtime.  30 tablet  2   No current facility-administered medications on file prior to visit.    BP 140/94  Pulse 88  Temp(Src) 98.4 F (36.9 C) (Oral)  Resp 16  Ht 5\' 3"  (1.6 m)  Wt 161 lb 1.3 oz (73.065 kg)  BMI 28.54 kg/m2  SpO2 99%  LMP 12/01/2012        Objective:   Physical Exam Physical Exam  Constitutional: She is oriented to person, place, and time. She appears well-developed and well-nourished. No distress.  HENT:  Head: Normocephalic and  atraumatic.  Right Ear: Tympanic membrane and ear canal normal.  Left Ear: Tympanic membrane and ear canal normal.  Mouth/Throat: Oropharynx is clear and moist.  Eyes: Pupils are equal, round, and reactive to light. No scleral icterus.  Neck: Normal range of motion. No thyromegaly present.  Cardiovascular: Normal rate and regular rhythm.   No murmur heard. Pulmonary/Chest: Effort normal and breath sounds normal. No respiratory distress. He has no wheezes. She has no rales. She exhibits no tenderness.  Abdominal: Soft. Bowel sounds are normal. He exhibits no distension and no mass. There is no tenderness. There is no rebound and no guarding.  Musculoskeletal: She exhibits no edema.  Lymphadenopathy:    She has no cervical adenopathy.  Neurological:  She is alert and oriented to person, place, and time.  She exhibits normal muscle tone. Coordination normal.  Skin: Skin is warm and dry.  Psychiatric: She has a normal mood and affect. Her behavior is normal. Judgment and thought content normal.  Breasts: Examined lying Right: Without masses, retractions, discharge or axillary adenopathy.  Left: Without masses, retractions, discharge or axillary adenopathy.  Inguinal/mons: Normal without inguinal adenopathy  External genitalia: Normal  BUS/Urethra/Skene's glands: Normal  Bladder: Normal  Vagina: Normal  Cervix: Normal  Uterus: normal in size, shape and contour. Midline and mobile  Adnexa/parametria:  Rt: Without masses or tenderness.  Lt: Without masses or tenderness.  Anus and perineum: Normal           Assessment & Plan:         Assessment & Plan:         Assessment & Plan:

## 2012-12-09 ENCOUNTER — Telehealth: Payer: Self-pay | Admitting: Family

## 2012-12-09 LAB — URINALYSIS, ROUTINE W REFLEX MICROSCOPIC
Glucose, UA: NEGATIVE mg/dL
Leukocytes, UA: NEGATIVE
Nitrite: NEGATIVE
Specific Gravity, Urine: 1.025 (ref 1.005–1.030)
pH: 5 (ref 5.0–8.0)

## 2012-12-09 LAB — TSH: TSH: 1.626 u[IU]/mL (ref 0.350–4.500)

## 2012-12-09 NOTE — Telephone Encounter (Signed)
Left message to return my call.  

## 2012-12-09 NOTE — Telephone Encounter (Signed)
One of her liver tests is elevated. Could be due to fatty liver.  Other lab work looks good. Work hard on diet, exercise and weight loss as we discussed at her visit.  I am also placing order for abdominal ultrasoundto check her liver.

## 2012-12-09 NOTE — Telephone Encounter (Signed)
Notified pt and she voices understanding. 

## 2012-12-10 ENCOUNTER — Encounter: Payer: Self-pay | Admitting: Family

## 2012-12-10 ENCOUNTER — Ambulatory Visit (HOSPITAL_BASED_OUTPATIENT_CLINIC_OR_DEPARTMENT_OTHER)
Admission: RE | Admit: 2012-12-10 | Discharge: 2012-12-10 | Disposition: A | Discharge status: Home / Self Care | Payer: BC Managed Care – PPO | Source: Ambulatory Visit | Attending: Family | Admitting: Family

## 2012-12-10 DIAGNOSIS — K7689 Other specified diseases of liver: Secondary | ICD-10-CM | POA: Insufficient documentation

## 2012-12-10 DIAGNOSIS — R7989 Other specified abnormal findings of blood chemistry: Secondary | ICD-10-CM | POA: Insufficient documentation

## 2012-12-10 DIAGNOSIS — K760 Fatty (change of) liver, not elsewhere classified: Secondary | ICD-10-CM | POA: Insufficient documentation

## 2012-12-12 ENCOUNTER — Encounter: Payer: Self-pay | Admitting: Family

## 2012-12-14 NOTE — Assessment & Plan Note (Addendum)
We discussed healthy diet, exercise, and weight loss.  Obtain fasting blood work. Flu shot today. Pap performed today.

## 2012-12-20 ENCOUNTER — Encounter: Payer: Self-pay | Admitting: Family

## 2012-12-20 ENCOUNTER — Ambulatory Visit (INDEPENDENT_AMBULATORY_CARE_PROVIDER_SITE_OTHER): Payer: BC Managed Care – PPO | Admitting: Family

## 2012-12-20 VITALS — BP 128/86 | HR 72 | Temp 98.2°F | Resp 16 | Ht 63.0 in | Wt 161.1 lb

## 2012-12-20 DIAGNOSIS — R030 Elevated blood-pressure reading, without diagnosis of hypertension: Secondary | ICD-10-CM

## 2012-12-20 DIAGNOSIS — K7689 Other specified diseases of liver: Secondary | ICD-10-CM

## 2012-12-20 DIAGNOSIS — K760 Fatty (change of) liver, not elsewhere classified: Secondary | ICD-10-CM

## 2012-12-20 DIAGNOSIS — G43909 Migraine, unspecified, not intractable, without status migrainosus: Secondary | ICD-10-CM

## 2012-12-20 NOTE — Assessment & Plan Note (Signed)
Improved. Continue healthy low sodium diet, exercise, weight loss.  Follow up in 3 months.

## 2012-12-20 NOTE — Assessment & Plan Note (Addendum)
Patient denies worsening headaches despite elevated blood pressure. Continue topamax and prn Imitrex Follow up as needed.  I have personally seen and examined this patient.  I agree with Graylon Gunning assessment and plan.

## 2012-12-20 NOTE — Assessment & Plan Note (Signed)
Patient reports to be exercising and has decreased fried, fatty food intake.  Follow up in three months.

## 2012-12-20 NOTE — Patient Instructions (Signed)
Please continue working hard on exercise,  Low sodium diet and weight loss.  Follow up in 3 months.

## 2012-12-20 NOTE — Progress Notes (Signed)
Subjective:    Patient ID: Christina Harmon, female    DOB: 12-28-1985, 27 y.o.   MRN: [id number]  HPI [name] is a 27 year old female who presents today for follow up of elevated blood pressure.  Patient denies chest pain, shortness of breath, blurred/change in vision, and worsening headaches. Naelani reports she has cut down on her sodium and fast food intake, has started eating fresh fruits and vegetables, drinking 3 bottles of water daily, and is exercising 7 minutes on her elliptical machine 3 to 4 days weekly.  Fatty liver- last visit lft's were elevated. Abdominal US was performed which revealed fatty liver.         Review of Systems  Constitutional: Negative for activity change and fatigue.  Respiratory: Negative for chest tightness and shortness of breath.   Cardiovascular: Negative for chest pain.  Neurological: Negative for headaches.       Reports history of migraines, but does not have any worsening symptoms from baseline.   No past medical history on file.  History   Social History  . Marital Status: Married    Spouse Name: N/A    Number of Children: N/A  . Years of Education: N/A   Occupational History  . Not on file.   Social History Main Topics  . Smoking status: Never Smoker   . Smokeless tobacco: Never Used  . Alcohol Use: No  . Drug Use: No  . Sexual Activity: Not on file   Other Topics Concern  . Not on file   Social History Narrative   Caffeine use:  Occasional   Regular exercise:  No          Past Surgical History  Procedure Laterality Date  . Ankle surgery  2002    cyst removed from right ankle    Family History  Problem Relation Age of Onset  . Depression Mother   . Hypothyroidism Mother   . Hypertension Father   . COPD Maternal Grandfather   . Stroke Maternal Grandfather     mini strokes  . Stroke Paternal Grandmother     mini strokes  . Cancer Other     leukemia--maternal great aunt    No Known Allergies  Current  Outpatient Prescriptions on File Prior to Visit  Medication Sig Dispense Refill  . Calcium Carbonate-Vitamin D (CALTRATE 600+D) 600-400 MG-UNIT per tablet Take 1 tablet by mouth 2 (two) times daily.      Colleen Can FE 1.5/30 1.5-30 MG-MCG tablet TAKE 1 TABLET BY MOUTH DAILY.  28 tablet  2  . SUMAtriptan (IMITREX) 50 MG tablet TAKE 1 TABLET (50 MG TOTAL) BY MOUTH ONCE AS NEEDED FOR MIGRAINE.  6 tablet  1  . topiramate (TOPAMAX) 25 MG tablet Take 1 tablet (25 mg total) by mouth at bedtime.  30 tablet  2   No current facility-administered medications on file prior to visit.    BP 128/86  Pulse 72  Temp(Src) 98.2 F (36.8 C) (Oral)  Resp 16  Ht 5\' 3"  (1.6 m)  Wt 161 lb 1.9 oz (73.084 kg)  BMI 28.55 kg/m2  SpO2 99%  LMP 12/01/2012        Objective:   Physical Exam  Constitutional: She is oriented to person, place, and time. She appears well-nourished.  Cardiovascular: Normal rate, regular rhythm and normal heart sounds.   No murmur heard. Pulmonary/Chest: Effort normal and breath sounds normal. No respiratory distress. She has no wheezes.  Neurological: She is alert and  oriented to person, place, and time.  Skin: Skin is warm and dry.  Psychiatric: She has a normal mood and affect.          Assessment & Plan:

## 2013-01-05 ENCOUNTER — Other Ambulatory Visit: Payer: Self-pay | Admitting: Family

## 2013-01-07 NOTE — Telephone Encounter (Signed)
Refill sent per [facility] refill protocol/SLS  

## 2013-02-04 ENCOUNTER — Other Ambulatory Visit: Payer: Self-pay | Admitting: Family

## 2013-02-04 NOTE — Telephone Encounter (Signed)
Rx request to pharmacy/SLS  

## 2013-03-21 ENCOUNTER — Ambulatory Visit: Payer: BC Managed Care – PPO | Admitting: Family

## 2013-04-05 ENCOUNTER — Other Ambulatory Visit: Payer: Self-pay | Admitting: Family

## 2013-04-06 NOTE — Telephone Encounter (Signed)
Informed patient of medication refill and she states that she will have to call back to schedule appointment. She needs to look at her work schedule.

## 2013-04-06 NOTE — Telephone Encounter (Signed)
Pt was due for follow up in January and she cancelled appt.  Please call pt to reschedule her appt.

## 2013-04-28 ENCOUNTER — Other Ambulatory Visit: Payer: Self-pay | Admitting: Family

## 2013-04-29 NOTE — Telephone Encounter (Signed)
JUNEL FE 1.5/30 1.5-30 MG-MCG tablet 28 tablet 2 04/05/2013     Sig: TAKE 1 TABLET BY MOUTH DAILY.    E-Prescribing Status: Receipt confirmed by pharmacy (04/06/2013 3:24 PM EST)    Pharmacy    [id number] - [place], [place] - [address]   Rx request Denied-Too Soon for request/SLS

## 2013-05-22 ENCOUNTER — Other Ambulatory Visit: Payer: Self-pay | Admitting: Family

## 2013-05-23 NOTE — Telephone Encounter (Signed)
30 day supply sent to pharmacy.  Pt was due for follow up of her blood pressure in January. Please call pt to arrange appt and let her know we cannot give further refills until she is seen in the office.

## 2013-05-24 NOTE — Telephone Encounter (Signed)
Informed patient of medication refill and she states that she will have to look at her work schedule and call us back

## 2013-05-25 NOTE — Telephone Encounter (Signed)
No further refills will be given on any medication as pt told us the same thing in January and never called back to schedule appt.

## 2014-03-06 ENCOUNTER — Encounter (HOSPITAL_COMMUNITY): Payer: Self-pay | Admitting: Family Medicine

## 2014-03-06 ENCOUNTER — Emergency Department (HOSPITAL_COMMUNITY)
Admission: EM | Admit: 2014-03-06 | Discharge: 2014-03-06 | Disposition: A | Discharge status: Home / Self Care | Payer: BC Managed Care – PPO | Attending: Emergency Medicine | Admitting: Emergency Medicine

## 2014-03-06 DIAGNOSIS — Z79899 Other long term (current) drug therapy: Secondary | ICD-10-CM | POA: Insufficient documentation

## 2014-03-06 DIAGNOSIS — G932 Benign intracranial hypertension: Secondary | ICD-10-CM | POA: Diagnosis not present

## 2014-03-06 DIAGNOSIS — H4710 Unspecified papilledema: Secondary | ICD-10-CM | POA: Diagnosis not present

## 2014-03-06 DIAGNOSIS — H5710 Ocular pain, unspecified eye: Secondary | ICD-10-CM | POA: Diagnosis present

## 2014-03-06 LAB — GRAM STAIN: Special Requests: NORMAL

## 2014-03-06 LAB — CSF CELL COUNT WITH DIFFERENTIAL
RBC Count, CSF: 1 /mm3 — ABNORMAL HIGH
RBC Count, CSF: 1 /mm3 — ABNORMAL HIGH
Tube #: 1
Tube #: 4
WBC, CSF: 0 /mm3 (ref 0–5)
WBC, CSF: 1 /mm3 (ref 0–5)

## 2014-03-06 LAB — GLUCOSE, CSF: Glucose, CSF: 61 mg/dL (ref 43–76)

## 2014-03-06 LAB — PROTEIN, CSF: Total  Protein, CSF: 16 mg/dL (ref 15–45)

## 2014-03-06 MED ORDER — LIDOCAINE HCL 2 % IJ SOLN
20.0000 mL | Freq: Once | INTRAMUSCULAR | Status: AC
Start: 2014-03-06 — End: 2014-03-06
  Administered 2014-03-06: 400 mg via INTRADERMAL
  Filled 2014-03-06: qty 20

## 2014-03-06 MED ORDER — ACETAZOLAMIDE 250 MG PO TABS: 250.0000 mg | ORAL_TABLET | Freq: Two times a day (BID) | ORAL | Status: DC

## 2014-03-06 NOTE — Discharge Instructions (Signed)
Please read and follow all provided instructions.  Your diagnoses today include:  1. Pseudotumor cerebri   2. Papilledema     Tests performed today include:  Cerebrospinal fluid tests - appears normal  Lumbar puncture - shows high pressure  Vital signs. See below for your results today.   Medications prescribed:   Diamox - medication to help control cerebrospinal fluid pressure  Take any prescribed medications only as directed.  Home care instructions:  Follow any educational materials contained in this packet.  BE VERY CAREFUL not to take multiple medicines containing Tylenol (also called acetaminophen). Doing so can lead to an overdose which can damage your liver and cause liver failure and possibly death.   Follow-up instructions: Please follow-up with the neurologist referrals for further evaluation of your symptoms as soon as possible.   Return instructions:   Please return to the Emergency Department if you experience worsening symptoms.   Return with severe headache, vomiting, worsening vision, fever.   Please return if you have any other emergent concerns.  Additional Information:  Your vital signs today were: BP 125/80 mmHg   Pulse 82   Temp(Src) 97.4 F (36.3 C) (Oral)   Resp 16   SpO2 97% If your blood pressure (BP) was elevated above 135/85 this visit, please have this repeated by your doctor within one month. --------------

## 2014-03-06 NOTE — ED Provider Notes (Signed)
[facility] change in [facility].   Monitoring CSF and when return will read - if unremarkable, patient can be discharged.   Results for orders placed or performed during the hospital encounter of 03/06/14  Gram stain - STAT with CSF culture  Result Value Ref Range   Specimen Description CSF    Special Requests Normal    Gram Stain      CYTOSPIN PREP WBC PRESENT, PREDOMINANTLY MONONUCLEAR NO ORGANISMS SEEN    Report Status 03/06/2014 FINAL   CSF cell count with differential collection tube #: 1  Result Value Ref Range   Tube # 1    Color, CSF COLORLESS COLORLESS   Appearance, CSF CLEAR CLEAR   Supernatant NOT INDICATED    RBC Count, CSF 1 (H) 0 /cu mm   WBC, CSF 1 0 - 5 /cu mm   Segmented Neutrophils-CSF TOO FEW TO COUNT, SMEAR AVAILABLE FOR REVIEW 0 - 6 %  CSF cell count with differential collection tube #: 4  Result Value Ref Range   Tube # 4    Color, CSF COLORLESS COLORLESS   Appearance, CSF CLEAR CLEAR   Supernatant NOT INDICATED    RBC Count, CSF 1 (H) 0 /cu mm   WBC, CSF 0 0 - 5 /cu mm   Segmented Neutrophils-CSF TOO FEW TO COUNT, SMEAR AVAILABLE FOR REVIEW 0 - 6 %  Protein, CSF  Result Value Ref Range   Total  Protein, CSF 16 15 - 45 mg/dL  Glucose, CSF  Result Value Ref Range   Glucose, CSF 61 43 - 76 mg/dL   Medications  lidocaine (XYLOCAINE) 2 % (with pres) injection 400 mg (400 mg Intradermal Given 03/06/14 1430)   Filed Vitals:   03/06/14 1545 03/06/14 1547 03/06/14 1615 03/06/14 1645  BP: 125/80 125/80 131/75 134/80  Pulse: 73 82 81 86  Temp:  97.4 F (36.3 C)  97.5 F (36.4 C)  TempSrc:  Oral  Oral  Resp: 18 16    SpO2: 98% 97% 97% 96%   Diagnoses that have been ruled out:  None  Diagnoses that are still under consideration:  None  Final diagnoses:  Papilledema  Pseudotumor cerebri    CSF unremarkable - negative WBC noted. Discussed labs with patient in great detail and plan for discharge. Patient stable,  afebrile. Patient stable for discharge.  Raymon MuttonMarissa Horton [place], PA-C 03/06/14 1825  Raymon MuttonMarissa Kadi Hession, PA-C 03/07/14 0258  Merrie RoofJohn David Wofford III, MD 03/18/14 [phone]

## 2014-03-06 NOTE — ED Provider Notes (Signed)
CSN: [id number]     Arrival date & time 03/06/14  1117 History   First MD Initiated Contact with Patient 03/06/14 1240     Chief Complaint  Patient presents with  . Eye Problem     (Consider location/radiation/quality/duration/timing/severity/associated sxs/prior Treatment) HPI Comments: Patient with history of migraine headaches presents with complaint of headache, double vision, blurry vision for 3 weeks. No injury or fever. Patient was initially evaluated by PCP who prescribed antibiotics for fluid in the ear. Patient was subsequently seen [facility] department and had a CT and MRI that she was told were negative. She followed up with an ophthalmologist today who found bilateral papilledema and was referred then to the emergency department for evaluation. No history of traumatic brain injury or meningitis. No nausea, vomiting. No treatments prior to arrival.  Patient is a 28 y.o. female presenting with eye problem. The history is provided by the patient and medical records.  Eye Problem Associated symptoms: headaches   Associated symptoms: no discharge, no nausea, no numbness, no photophobia, no redness, no vomiting and no weakness     History reviewed. No pertinent past medical history. Past Surgical History  Procedure Laterality Date  . Ankle surgery  2002    cyst removed from right ankle   Family History  Problem Relation Age of Onset  . Depression Mother   . Hypothyroidism Mother   . Hypertension Father   . COPD Maternal Grandfather   . Stroke Maternal Grandfather     mini strokes  . Stroke Paternal Grandmother     mini strokes  . Cancer Other     leukemia--maternal great aunt   History  Substance Use Topics  . Smoking status: Never Smoker   . Smokeless tobacco: Never Used  . Alcohol Use: No   OB History    No data available     Review of Systems  Constitutional: Negative for fever.  HENT: Negative for congestion, dental problem, rhinorrhea and  sinus pressure.   Eyes: Positive for visual disturbance. Negative for photophobia, discharge and redness.  Respiratory: Negative for shortness of breath.   Cardiovascular: Negative for chest pain.  Gastrointestinal: Negative for nausea and vomiting.  Musculoskeletal: Positive for neck pain. Negative for gait problem and neck stiffness.  Skin: Negative for rash.  Neurological: Positive for headaches. Negative for syncope, speech difficulty, weakness, light-headedness and numbness.  Psychiatric/Behavioral: Negative for confusion.    Allergies  Review of patient's allergies indicates no known allergies.  Home Medications   Prior to Admission medications   Medication Sig Start Date End Date Taking? Authorizing Provider  Cholecalciferol (VITAMIN D PO) Take 1 tablet by mouth daily.   Yes Historical Provider, MD  meclizine (ANTIVERT) 25 MG tablet Take 25 mg by mouth 3 (three) times daily as needed for dizziness.   Yes Historical Provider, MD  montelukast (SINGULAIR) 10 MG tablet Take 10 mg by mouth daily. 01/18/14  Yes Historical Provider, MD  SYMBICORT 160-4.5 MCG/ACT inhaler Inhale 2 puffs into the lungs every 12 (twelve) hours. 01/09/14  Yes Historical Provider, MD  Calcium Carbonate-Vitamin D (CALTRATE 600+D) 600-400 MG-UNIT per tablet Take 1 tablet by mouth 2 (two) times daily. Patient not taking: Reported on 03/06/2014 03/24/11   Sandford CrazeMelissa O'Sullivan, NP  JUNEL FE 1.5/30 1.5-30 MG-MCG tablet TAKE 1 TABLET BY MOUTH DAILY. Patient not taking: Reported on 03/06/2014    Sandford CrazeMelissa O'Sullivan, NP  SUMAtriptan (IMITREX) 50 MG tablet TAKE 1 TABLET (50 MG TOTAL) BY MOUTH ONCE AS NEEDED  FOR MIGRAINE. Patient not taking: Reported on 03/06/2014 01/05/13   Sandford Craze, NP  topiramate (TOPAMAX) 25 MG tablet Take 1 tablet (25 mg total) by mouth at bedtime. Patient not taking: Reported on 03/06/2014 10/01/12   Sandford Craze, NP   BP 152/83 mmHg  Pulse 102  Temp(Src) 98.3 F (36.8 C)  Resp 18   SpO2 98%   Physical Exam  Constitutional: She is oriented to person, place, and time. She appears well-developed and well-nourished.  HENT:  Head: Normocephalic and atraumatic.  Right Ear: Tympanic membrane, external ear and ear canal normal.  Left Ear: Tympanic membrane, external ear and ear canal normal.  Nose: Nose normal.  Mouth/Throat: Uvula is midline, oropharynx is clear and moist and mucous membranes are normal.  Eyes: Conjunctivae, EOM and lids are normal. Pupils are equal, round, and reactive to light. Right eye exhibits no nystagmus. Left eye exhibits no nystagmus.  Neck: Normal range of motion. Neck supple.  Cardiovascular: Normal rate and regular rhythm.   Pulmonary/Chest: Effort normal and breath sounds normal. No respiratory distress. She has no wheezes. She has no rales.  Abdominal: Soft. There is no tenderness.  Musculoskeletal:       Cervical back: She exhibits normal range of motion, no tenderness and no bony tenderness.  Neurological: She is alert and oriented to person, place, and time. She has normal strength and normal reflexes. A cranial nerve deficit is present. No sensory deficit. She displays a negative Romberg sign. Coordination and gait normal. GCS eye subscore is 4. GCS verbal subscore is 5. GCS motor subscore is 6.  + strabismus, + cover/uncover when covering L eye  Skin: Skin is warm and dry.  Psychiatric: She has a normal mood and affect.  Nursing note and vitals reviewed.   ED Course  LUMBAR PUNCTURE Date/Time: 03/06/2014 3:37 PM Performed by: Renne Crigler Authorized by: Renne Crigler Consent: Verbal consent obtained. Written consent obtained. Risks and benefits: risks, benefits and alternatives were discussed Consent given by: patient Patient understanding: patient states understanding of the procedure being performed Patient consent: the patient's understanding of the procedure matches consent given Procedure consent: procedure consent matches  procedure scheduled Relevant documents: relevant documents present and verified Test results: test results available and properly labeled Site marked: the operative site was marked Imaging studies: imaging studies available (written MRI/CT reports) Patient identity confirmed: verbally with patient, arm band and provided demographic data Time out: Immediately prior to procedure a "time out" was called to verify the correct patient, procedure, equipment, support staff and site/side marked as required. Indications: evaluation for symptomatic pseudotumor cerebri. Anesthesia: local infiltration Local anesthetic: lidocaine 2% without epinephrine Anesthetic total: 10 ml Patient sedated: no Preparation: Patient was prepped and draped in the usual sterile fashion. Lumbar space: L3-L4 interspace Patient's position: left lateral decubitus Needle gauge: 20 Needle type: spinal needle - Quincke tip Needle length: 2.5 in Number of attempts: 1 Opening pressure: 50 cm H2O Fluid appearance: clear Tubes of fluid: 4 Total volume: 22 ml Post-procedure: site cleaned and adhesive bandage applied Patient tolerance: Patient tolerated the procedure well with no immediate complications Comments: Patient tolerated without complication   (including critical care time) Labs Review Labs Reviewed  CSF CULTURE  GRAM STAIN  CSF CELL COUNT WITH DIFFERENTIAL  CSF CELL COUNT WITH DIFFERENTIAL  PROTEIN, CSF  GLUCOSE, CSF    Imaging Review No results found.   EKG Interpretation None       1:04 PM Patient seen and examined. Discussed with Dr.  Wofford. Will ask neuro for recommendations. Will obtain records from [facility].   Vital signs reviewed and are as follows: BP 152/83 mmHg  Pulse 102  Temp(Src) 98.3 F (36.8 C)  Resp 18  SpO2 98%  2:20 PM Records reviewed. MRI and [facility]. I have asked that these are scanned into patient records. Discussed previously with neurology (Dr.  Leroy Kennedyamilo). They recommend LP with neg MRI. Patient agrees to proceed.   Discussed with Dr. Loretha StaplerWofford.   3:30 PM LP performed. Opening pressure > 50. I removed approximately 25cc CSF. CSF labs sent.   Spoke with Dr. Leroy Kennedyamilo, reccs starting on Diamox 250mg  bid.   4:47 PM Awaiting CSF labs. Patient rechecked. Her HA and blurry vision are still present but subjectively improved. Handoff to [place] PA-C at shift change. Will d/c to home if results are neg. Rx for acetazolamide. Neuro referrals given.   Pt told to return with worsening symptoms, vomiting, fever, other concerns.   We discussed post-LP precautions.     MDM   Final diagnoses:  Papilledema  Pseudotumor cerebri   Patient with HA, double vision, papilledema x 3 weeks. LP here with opening pressure > 50. Approx 25cc CSF collected. CSF studies pending. Do not suspect meningitis. Neuro consulted by telephone. She will follow-up with neuro.   No dangerous or life-threatening conditions suspected or identified by history, physical exam, and by work-up. No indications for hospitalization identified.      Renne CriglerJoshua Makena Murdock, PA-C 03/06/14 1650  Merrie RoofJohn David Wofford III, MD 03/06/14 [phone]

## 2014-03-06 NOTE — ED Notes (Signed)
Pt sts she has been having headache, blurred vision and double vision. sts was seen at [place] and MRI that was negative. sts told to go to the eye doctor. sts she went there and they said her optic nerve was swollen and some fluid and told to come here.

## 2014-03-06 NOTE — ED Notes (Signed)
PA at the bedside preforming LP.

## 2014-03-08 ENCOUNTER — Encounter: Payer: Self-pay | Admitting: Neurology

## 2014-03-08 ENCOUNTER — Ambulatory Visit (INDEPENDENT_AMBULATORY_CARE_PROVIDER_SITE_OTHER): Payer: BC Managed Care – PPO

## 2014-03-08 ENCOUNTER — Ambulatory Visit (INDEPENDENT_AMBULATORY_CARE_PROVIDER_SITE_OTHER): Payer: BC Managed Care – PPO | Admitting: Neurology

## 2014-03-08 VITALS — BP 120/79 | HR 76 | Temp 97.0°F | Ht 63.0 in | Wt 167.0 lb

## 2014-03-08 DIAGNOSIS — H4710 Unspecified papilledema: Secondary | ICD-10-CM | POA: Insufficient documentation

## 2014-03-08 DIAGNOSIS — G932 Benign intracranial hypertension: Secondary | ICD-10-CM

## 2014-03-08 DIAGNOSIS — G971 Other reaction to spinal and lumbar puncture: Secondary | ICD-10-CM

## 2014-03-08 MED ORDER — KETOROLAC TROMETHAMINE 60 MG/2ML IM SOLN
30.0000 mg | Freq: Once | INTRAMUSCULAR | Status: AC
  Administered 2014-03-08: 30 mg via INTRAMUSCULAR

## 2014-03-08 NOTE — Patient Instructions (Signed)
Patient was given 30mg  of Toradol in R Deltoid. Band Aid applied. Tolerated well.

## 2014-03-08 NOTE — Progress Notes (Addendum)
[facility]    Provider:  Dr Lucia GaskinsAhern Referring Provider: Sandford Craze'Sullivan, Melissa, NP Primary Care Physician:  Lemont Fillers'SULLIVAN,MELISSA S., NP  CC:  Pseudotumor cerebri  HPI:  Denice Paradiseshley R Benedict is a 28 y.o. female here as a referral from Dr. Peggyann Juba'Sullivan for Pseudotumor cerebri  Has had headaches all her life. She is having double vision since December 4th. She was admitted to [facility] for diplopia and MRI of the brain and cat scan were both normal (per patient, no records). She went to an eye doctor who sent her to [facility] for [facility]illed[facility]. Opening pressure was 50. She was started on Diamox 250mg  twice daily just a few days ago. She has pounding pressure behind the eyes. Can be 10/10 pain. She started having these headaches on December 9th/10th. . She saw Dr. Precious BardSnipes, ophthalmologist and she reports vision is fine (do not have records) but he noticed the papilledema and sent [facility] to [facility]. The headache today is 8/10 but it feels different, worse when sitting up and better when laying down - has had this type of headache since the lumbar puncture. She feels like she is going to pass out right now. She has light sensitivity, sound sensitivity. Light triggers the headaches. Having them daily. Up to 8 hours a day. If she can sit in a dark room she feels better. She felt better after the large volume lumbar tap. She just started the diamox 2 days ago. No hearing changes. No visual obscurations. She tried topamax in the past and it did not help her migraines.   Reviewed notes, labs and imaging from outside physicians, which showed: CSF cell count and diff, protein, glucose, gram stain unremarkable. CMP/BMP/TSH all WNL. Reviewed notes from the ED which showed: on the 28th she presented for bilat pappiledema. They did receive records from [facility] and reviewed, MRI and CT negative (I do not have those records, need to review them myself), LP opening pressure was 50 however unsure of positioning  (lateral decub?), 25 cc removed, started on diamox 250mg  bid.   Review of Systems: Patient complains of symptoms per HPI as well as the following symptoms: blurred vision, double vision, eye pain, headache, dizziness, passing out, migraine. Pertinent negatives per HPI. All others negative.   History   Social History  . Marital Status: Married    Spouse Name: N/A    Number of Children: N/A  . Years of Education: N/A   Occupational History  . Not on file.   Social History Main Topics  . Smoking status: Never Smoker   . Smokeless tobacco: Never Used  . Alcohol Use: No  . Drug Use: No  . Sexual Activity: Not on file   Other Topics Concern  . Not on file   Social History Narrative   Caffeine use:  Occasional   Regular exercise:  No          Family History  Problem Relation Age of Onset  . Depression Mother   . Hypothyroidism Mother   . Hypertension Father   . COPD Maternal Grandfather   . Stroke Maternal Grandfather     mini strokes  . Stroke Paternal Grandmother     mini strokes  . Cancer Other     leukemia--maternal great aunt    Past Medical History  Diagnosis Date  . Headache   . Papilledema     Past Surgical History  Procedure Laterality Date  . Ankle surgery  2002    cyst removed from right  ankle    Current Outpatient Prescriptions  Medication Sig Dispense Refill  . acetaZOLAMIDE (DIAMOX) 250 MG tablet Take 1 tablet (250 mg total) by mouth 2 (two) times daily. 60 tablet 1  . Cholecalciferol (VITAMIN D PO) Take 1 tablet by mouth daily.    . montelukast (SINGULAIR) 10 MG tablet Take 10 mg by mouth daily.  5  . SYMBICORT 160-4.5 MCG/ACT inhaler Inhale 2 puffs into the lungs every 12 (twelve) hours.  1   No current facility-administered medications for this visit.    Allergies as of 03/08/2014  . (No Known Allergies)    Vitals: BP 120/79 mmHg  Pulse 76  Temp(Src) 97 F (36.1 C) (Oral)  Ht 5\' 3"  (1.6 m)  Wt 167 lb (75.751 kg)  BMI 29.59  kg/m2 Last Weight:  Wt Readings from Last 1 Encounters:  03/08/14 167 lb (75.751 kg)   Last Height:   Ht Readings from Last 1 Encounters:  03/08/14 5\' 3"  (1.6 m)    Physical exam: Exam: Gen: In pain, laying on the table, conversant, well nourised, well groomed                     CV: RRR, no MRG. No Carotid Bruits. No peripheral edema, warm, nontender Eyes: Conjunctivae clear without exudates or hemorrhage  Neuro: Detailed Neurologic Exam  Speech:    Speech is normal; fluent and spontaneous with normal comprehension.  Cognition:    The patient is oriented to person, place, and time;     recent and remote memory intact;     language fluent;     normal attention, concentration,     fund of knowledge Cranial Nerves:    The pupils are equal, round, and reactive to light. Bilateral papilledema. Visual fields are full to finger confrontation. Extraocular movements are intact, mild strabismus. Trigeminal sensation is intact and the muscles of mastication are normal. The face is symmetric. The palate elevates in the midline. Voice is normal. Hearing intact. Shoulder shrug is normal. The tongue has normal motion without fasciculations.   Coordination:    Normal finger to nose and heel to shin. Normal rapid alternating movements.   Gait:    Heel-toe and tandem gait are normal.   Motor Observation:    No asymmetry, no atrophy, and no involuntary movements noted. Tone:    Normal muscle tone.    Posture:    Posture is normal. normal erect    Strength:    Strength is V/V in the upper and lower limbs.      Sensation: intact     Reflex Exam:  DTR's:    Deep tendon reflexes in the upper and lower extremities are normal bilaterally.   Toes:    The toes are downgoing bilaterally.   Clonus:    Clonus is absent.      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 She was started on diamox. She has new headache symptoms since the LP. Neuro exam significant for bilateral papilledema.  IIH: Continue diamox. Can consider increasing to 250mg  tid or adding lasix if headaches persist. Hve [facility] and her ophthalmologist.  Post-LP headache: Recommend she stay home and rest until Monday. Fluids and caffeine may help. If persists, may need a blood patch. Toradol IM was given today due to severe pain. Migraines: May also improve with the diamox, will follow  clinically Vision: need to review visual field formal tests. There can be permanent vision loss due to IIH. regilar follow up woth ophthalmology.   Addendum: Received records from [facility]. Where she was seen in 02/2014. She presented with blurred and double vision since December 4th, headache. MRi of the brain per report was normal. She was sent home with a referred to Opthalmology. Reviewed Opthalmology records, who identified papilledema bilat; significant disk edema and swelling with multiple peripapillary heme bilat and patient was sent directly to [place] ER for Lumbar Puncture.    Naomie Dean, MD  [facility] [address] [place], [place] [address]  Phone [phone] Fax [phone]

## 2014-03-08 NOTE — Patient Instructions (Signed)
Overall you are doing fairly well but I do want to suggest a few things today:   Remember to drink plenty of fluid, eat healthy meals and do not skip any meals. Try to eat protein with a every meal and eat a healthy snack such as fruit or nuts in between meals. Try to keep a regular sleep-wake schedule and try to exercise daily, particularly in the form of walking, 20-30 minutes a day, if you can.   As far as your medications are concerned, I would like to suggest: continue Diamox twice daily  As far as diagnostic testing: We will request medical records from ophthalmologist and ED in [facility]  I would like to see you back in 6 weeks, sooner if we need to. Please call us with any interim questions, concerns, problems, updates or refill requests.   Please also call us for any test results so we can go over those with you on the phone.  My clinical assistant and will answer any of your questions and relay your messages to me and also relay most of my messages to you.   Our phone number is [phone]. We also have an after hours call service for urgent matters and there is a physician on-call for urgent questions. For any emergencies you know to call 911 or go to the nearest emergency room

## 2014-03-09 ENCOUNTER — Telehealth: Payer: Self-pay | Admitting: *Deleted

## 2014-03-09 NOTE — Telephone Encounter (Signed)
Received records from [facility] and Dr Precious BardSnipes in [place],[name] requested 03/09/14.

## 2014-03-12 LAB — CSF CULTURE: SPECIAL REQUESTS: NORMAL

## 2014-03-12 LAB — CSF CULTURE W GRAM STAIN: Culture: NO GROWTH

## 2014-04-11 ENCOUNTER — Ambulatory Visit (INDEPENDENT_AMBULATORY_CARE_PROVIDER_SITE_OTHER): Payer: BLUE CROSS/BLUE SHIELD | Admitting: Neurology

## 2014-04-11 ENCOUNTER — Encounter: Payer: Self-pay | Admitting: Neurology

## 2014-04-11 VITALS — BP 122/87 | HR 73 | Ht 63.0 in | Wt 162.0 lb

## 2014-04-11 DIAGNOSIS — Z79899 Other long term (current) drug therapy: Secondary | ICD-10-CM

## 2014-04-11 DIAGNOSIS — G932 Benign intracranial hypertension: Secondary | ICD-10-CM

## 2014-04-11 DIAGNOSIS — G4489 Other headache syndrome: Secondary | ICD-10-CM

## 2014-04-11 MED ORDER — ACETAZOLAMIDE 250 MG PO TABS: 250.0000 mg | ORAL_TABLET | Freq: Three times a day (TID) | ORAL | Status: DC

## 2014-04-11 NOTE — Patient Instructions (Signed)
Overall you are doing fairly well but I do want to suggest a few things today:   Remember to drink plenty of fluid, eat healthy meals and do not skip any meals. Try to eat protein with a every meal and eat a healthy snack such as fruit or nuts in between meals. Try to keep a regular sleep-wake schedule and try to exercise daily, particularly in the form of walking, 20-30 minutes a day, if you can.   As far as your medications are concerned, I would like to suggest: Increase diamox to three times a day  As far as diagnostic testing: lab work  I would like to see you back in 4 weeks, sooner if we need to. Please call us with any interim questions, concerns, problems, updates or refill requests.   Please also call us for any test results so we can go over those with you on the phone.  My clinical assistant and will answer any of your questions and relay your messages to me and also relay most of my messages to you.   Our phone number is [phone]. We also have an after hours call service for urgent matters and there is a physician on-call for urgent questions. For any emergencies you know to call 911 or go to the nearest emergency room

## 2014-04-11 NOTE — Progress Notes (Signed)
[facility]    Provider:  Dr Lucia GaskinsAhern Referring Provider: Sandford Craze'Sullivan, Melissa, NP Primary Care Physician:  Lemont Fillers'SULLIVAN,MELISSA S., NP  CC: Pseudotumor cerebri  Interval History 04/11/2014: Patient is here for follow up of IIH. She is here with her mother. She is doing great, vision is improving. She can see in her peripheral fields now. She still has diplopia but that is improving. No headache. She has not been back to see opthalmologist. Having some tingling in the fingers and urinating a lot but otherwise is doing well with the medication. She is very happy. We discussed IIH (ie pseudotumor) at length with mother and daughter, its causes, diagnosis and treatment and future treatment plan.    Review of Systems: Patient complains of symptoms per HPI as well as the following symptoms: diplopia, no CP, no SOB . Pertinent negatives per HPI. All others negative.   Initial visit 03/08/14: Christina Harmon is a 29 y.o. female here as a referral from Dr. Peggyann Juba'Sullivan for Pseudotumor cerebri  Has had headaches all her life. She is having double vision since December 4th. She was admitted to [place] [facility] for diplopia and MRI of the brain and cat scan were both normal (per patient, no records). She went to an eye doctor who sent her to [facility] for papilledema. Opening pressure was 50. She was started on Diamox 250mg  twice daily just a few days ago. She has pounding pressure behind the eyes. Can be 10/10 pain. She started having these headaches on December 9th/10th. . She saw Dr. Precious BardSnipes, ophthalmologist and she reports vision is fine (do not have records) but he noticed the papilledema and [facility] to [facility]. The headache today is 8/10 but it feels different, worse when sitting up and better when laying down - has had this type of headache since the lumbar puncture. She feels like she is going to pass out right now. She has light sensitivity, sound sensitivity. Light triggers the  headaches. Having them daily. Up to 8 hours a day. If she can sit in a dark room she feels better. She felt better after the large volume lumbar tap. She just started the diamox 2 days ago. No hearing changes. No visual obscurations. She tried topamax in the past and it did not help her migraines.   Reviewed notes, labs and imaging from outside physicians, which showed: CSF cell count and diff, protein, glucose, gram stain unremarkable. CMP/BMP/TSH all WNL. Reviewed notes from the ED which showed: on the 28th she presented for bilat pappiledema. They did receive records from [place] and reviewed, MRI and CT negative (I do not have those records, need to review them myself), LP opening pressure was 50 however unsure of positioning (lateral decub?), 25 cc removed, started on diamox 250mg  bid.    History   Social History  . Marital Status: Married    Spouse Name: N/A    Number of Children: N/A  . Years of Education: N/A   Occupational History  . Not on file.   Social History Main Topics  . Smoking status: Never Smoker   . Smokeless tobacco: Never Used  . Alcohol Use: No  . Drug Use: No  . Sexual Activity: Not on file   Other Topics Concern  . Not on file   Social History Narrative   Caffeine use:  Occasional   Regular exercise:  No          Family History  Problem Relation Age of Onset  . Depression  Mother   . Hypothyroidism Mother   . Hypertension Father   . COPD Maternal Grandfather   . Stroke Maternal Grandfather     mini strokes  . Stroke Paternal Grandmother     mini strokes  . Cancer Other     leukemia--maternal great aunt    Past Medical History  Diagnosis Date  . Headache   . Papilledema     Past Surgical History  Procedure Laterality Date  . Ankle surgery  2002    cyst removed from right ankle    Current Outpatient Prescriptions  Medication Sig Dispense Refill  . acetaZOLAMIDE (DIAMOX) 250 MG tablet Take 1 tablet (250 mg total) by mouth 3 (three)  times daily. 90 tablet 1  . Cholecalciferol (VITAMIN D PO) Take 1 tablet by mouth daily.    . montelukast (SINGULAIR) 10 MG tablet Take 1 tablet by mouth daily.  5  . SYMBICORT 160-4.5 MCG/ACT inhaler Inhale 2 puffs into the lungs every 12 (twelve) hours.  1   No current facility-administered medications for this visit.    Allergies as of 04/11/2014  . (No Known Allergies)    Vitals: BP 122/87 mmHg  Pulse 73  Ht  (1.6 m)  Wt 162 lb (73.483 kg)  BMI 28.70 kg/m2 Last Weight:  Wt Readings from Last 1 Encounters:  04/11/14 162 lb (73.483 kg)   Last Height:   Ht Readings from Last 1 Encounters:  04/11/14  (1.6 m)    Neuro: Detailed Neurologic Exam  Speech:  Speech is normal; fluent and spontaneous with normal comprehension.  Cognition:  The patient is oriented to person, place, and time;   recent and remote memory intact;   language fluent;   normal attention, concentration,   fund of knowledge Cranial Nerves:  The pupils are equal, round, and reactive to light. Bilateral papilledema left > right with improvement from last exam. Visual fields are full to finger confrontation. Extraocular movements are intact, mild strabismus. Trigeminal sensation is intact and the muscles of mastication are normal. The face is symmetric. The palate elevates in the midline. Voice is normal. Hearing intact. Shoulder shrug is normal. The tongue has normal motion without fasciculations.       Assessment/Plan:  Assessment/Plan: 29 year old female with bilateral papilledema and opening pressure of 50 here for new evaluation of Idiopathic Intracranial Hypertension (pseudotumor cerebri). She was started on diamox. She has new headache symptoms since the LP. Neuro exam significant for bilateral papilledema.  IIH: Improving, no more headaches. Continue diamox. Can increase to  tid. If does not tolerate can consider adding lasix. Will order CMP to check electrolytes  due to diamox treatment.  Post-LP headache: Resolved Migraines: May also improve with the diamox, will follow clinically Vision: Needs ollow up woth ophthalmology for VF testing   (previous Addendum: Received records from [facility]. Where she was seen in 02/2014. She presented with blurred and double vision since December 4th, headache. MRi of the brain per report was normal. She was sent home with a referred to Opthalmology. Reviewed Opthalmology records, who identified papilledema bilat; significant disk edema and swelling with multiple peripapillary heme bilat and patient was sent directly to [place] ER for Lumbar Puncture.)  Naomie Dean, MD  [facility] [address] [place], [place] [address]  Phone [phone] Fax [phone]  A total of 30 minutes was spent face-to-face in with this patient and her mother. Over half this time was spent on counseling patient  on the pseudotumor diagnosis and different diagnostic and therapeutic options available.

## 2014-04-12 LAB — COMPREHENSIVE METABOLIC PANEL
ALT: 35 IU/L — ABNORMAL HIGH (ref 0–32)
AST: 23 IU/L (ref 0–40)
Albumin/Globulin Ratio: 1.8 (ref 1.1–2.5)
Albumin: 4.4 g/dL (ref 3.5–5.5)
Alkaline Phosphatase: 77 IU/L (ref 39–117)
BILIRUBIN TOTAL: 0.4 mg/dL (ref 0.0–1.2)
BUN/Creatinine Ratio: 15 (ref 8–20)
BUN: 12 mg/dL (ref 6–20)
CALCIUM: 9.4 mg/dL (ref 8.7–10.2)
CO2: 19 mmol/L (ref 18–29)
Chloride: 106 mmol/L (ref 97–108)
Creatinine, Ser: 0.81 mg/dL (ref 0.57–1.00)
GFR calc Af Amer: 114 mL/min/{1.73_m2} (ref >59)
GFR calc non Af Amer: 99 mL/min/{1.73_m2} (ref >59)
Globulin, Total: 2.4 g/dL (ref 1.5–4.5)
Glucose: 86 mg/dL (ref 65–99)
POTASSIUM: 4 mmol/L (ref 3.5–5.2)
Sodium: 137 mmol/L (ref 134–144)
Total Protein: 6.8 g/dL (ref 6.0–8.5)

## 2014-04-14 ENCOUNTER — Telehealth: Payer: Self-pay | Admitting: *Deleted

## 2014-04-14 NOTE — Telephone Encounter (Signed)
-----   Message from Anson FretAntonia B Ahern, MD sent at 04/12/2014  5:51 PM EST ----- Please let patient know her labs were normal. thanks

## 2014-04-14 NOTE — Telephone Encounter (Signed)
Talked with patient about normal lab results. Patient verbalized understanding.  

## 2014-05-01 ENCOUNTER — Encounter: Payer: Self-pay | Admitting: Neurology

## 2014-05-11 ENCOUNTER — Telehealth: Payer: Self-pay | Admitting: Neurology

## 2014-05-11 NOTE — Telephone Encounter (Signed)
Patient questioning if she should keep appointment on 3/4 @ 10:45 with Dr. Lucia GaskinsAhern.  She wasn't able to see Opthalmologist today due to equipment was down.  Office will call her tomorrow to reschedule appointment.  Please call and advise.

## 2014-05-11 NOTE — Telephone Encounter (Signed)
Patient is feeling great, no problems. Told her to call after she sees the ophthalmologist and depending on the results of visual field testing we can arrange follow up afterwards.

## 2014-05-12 ENCOUNTER — Ambulatory Visit: Payer: Self-pay | Admitting: Neurology

## 2014-06-05 ENCOUNTER — Telehealth: Payer: Self-pay | Admitting: Neurology

## 2014-06-05 MED ORDER — ACETAZOLAMIDE 250 MG PO TABS: 250.0000 mg | ORAL_TABLET | Freq: Three times a day (TID) | ORAL | Status: DC

## 2014-06-05 NOTE — Telephone Encounter (Signed)
Patient stated Rx acetaZOLAMIDE (DIAMOX) 250 MG tablet from [facility] will cost her 180.00 a month.  Patient requesting 90 day supply for Rx acetaZOLAMIDE (DIAMOX) 250 MG tablet forwarded to fax [phone] and phone # [phone].  Please call and advise.

## 2014-06-05 NOTE — Telephone Encounter (Signed)
The number provided was for [facility].  They requested the Rx be sent electronically, which has been done.  I called back to advise.  She is aware.

## 2014-08-31 ENCOUNTER — Ambulatory Visit (INDEPENDENT_AMBULATORY_CARE_PROVIDER_SITE_OTHER): Payer: BLUE CROSS/BLUE SHIELD | Admitting: Neurology

## 2014-08-31 ENCOUNTER — Encounter: Payer: Self-pay | Admitting: Neurology

## 2014-08-31 VITALS — BP 126/89 | HR 76 | Temp 97.7°F | Ht 63.0 in | Wt 165.0 lb

## 2014-08-31 DIAGNOSIS — H4710 Unspecified papilledema: Secondary | ICD-10-CM | POA: Diagnosis not present

## 2014-08-31 DIAGNOSIS — H532 Diplopia: Secondary | ICD-10-CM | POA: Diagnosis not present

## 2014-08-31 DIAGNOSIS — R51 Headache with orthostatic component, not elsewhere classified: Secondary | ICD-10-CM

## 2014-08-31 DIAGNOSIS — G932 Benign intracranial hypertension: Secondary | ICD-10-CM

## 2014-08-31 DIAGNOSIS — R519 Headache, unspecified: Secondary | ICD-10-CM

## 2014-08-31 DIAGNOSIS — H538 Other visual disturbances: Secondary | ICD-10-CM

## 2014-08-31 MED ORDER — ACETAZOLAMIDE 250 MG PO TABS
250.0000 mg | ORAL_TABLET | Freq: Three times a day (TID) | ORAL | Status: DC
Start: 2014-08-31 — End: 2017-05-14

## 2014-08-31 NOTE — Patient Instructions (Signed)
Overall you are doing fairly well but I do want to suggest a few things today:   Remember to drink plenty of fluid, eat healthy meals and do not skip any meals. Try to eat protein with a every meal and eat a healthy snack such as fruit or nuts in between meals. Try to keep a regular sleep-wake schedule and try to exercise daily, particularly in the form of walking, 20-30 minutes a day, if you can.   As far as your medications are concerned, I would like to suggest: Acetazolamide 250mg  twice daily  As far as diagnostic testing: MRi of the brain and orbits  I would like to see you back in 4-6 weeks, sooner if we need to. Please call us with any interim questions, concerns, problems, updates or refill requests.   Please also call us for any test results so we can go over those with you on the phone.  My clinical assistant and will answer any of your questions and relay your messages to me and also relay most of my messages to you.   Our phone number is [phone]. We also have an after hours call service for urgent matters and there is a physician on-call for urgent questions. For any emergencies you know to call 911 or go to the nearest emergency room

## 2014-08-31 NOTE — Progress Notes (Signed)
[facility]    Provider:  Dr Lucia Gaskins Referring Provider: Sandford Craze, NP Primary Care Physician:  Lemont Fillers., NP CC: Pseudotumor cerebri  Interval History 08/31/2014: Patient is here for follow up of IIH. She stopped the Actezolamide and started having problems again. Then restarted it  twice a day about a month ago due to worsening headache which helped but 3-4 days ago it started getting worse and increased to 3x a day. Headaches are worsening. She is having the headaches and it may be sinuses, they are positional worse with bending down, was having them once a week but recently 3 days in a row. Lasting 30 minutes to an hour and responds to ibuprofen. In the frontal rea with pressure. She has blurry vision again and diplopia.   Interval History 04/11/2014: Patient is here for follow up of IIH. She is here with her mother. She is doing great, vision is improving. She can see in her peripheral fields now. She still has diplopia but that is improving. No headache. She has not been back to see opthalmologist. Having some tingling in the fingers and urinating a lot but otherwise is doing well with the medication. She is very happy. We discussed IIH (ie pseudotumor) at length with mother and daughter, its causes, diagnosis and treatment and future treatment plan.   Review of Systems: Patient complains of symptoms per HPI as well as the following symptoms: diplopia, no CP, no SOB . Pertinent negatives per HPI. All others negative.   Initial visit 03/08/14: Christina Harmon is a 29 y.o. female here as a referral from Dr. Peggyann Juba for Pseudotumor cerebri  Has had headaches all her life. She is having double vision since December 4th. She was admitted to [place] [facility] for diplopia and MRI of the brain and cat scan were both normal (per patient, no records). She went to an eye doctor who sent her to [facility] for papilledema. Opening pressure was 50. She was  started on Diamox  twice daily just a few days ago. She has pounding pressure behind the eyes. Can be 10/10 pain. She started having these headaches on December 9th/10th. . She saw Dr. Precious Bard, ophthalmologist and she reports vision is fine (do not have records) but he noticed the papilledema and [facility]. The headache today is 8/10 but it feels different, worse when sitting up and better when laying down - has had this type of headache since the lumbar puncture. She feels like she is going to pass out right now. She has light sensitivity, sound sensitivity. Light triggers the headaches. Having them daily. Up to 8 hours a day. If she can sit in a dark room she feels better. She felt better after the large volume lumbar tap. She just started the diamox 2 days ago. No hearing changes. No visual obscurations. She tried topamax in the past and it did not help her migraines.   Reviewed notes, labs and imaging from outside physicians, which showed: CSF cell count and diff, protein, glucose, gram stain unremarkable. CMP/BMP/TSH all WNL. Reviewed notes from the ED which showed: on the 28th she presented for bilat pappiledema. They did receive records from [place] and reviewed, MRI and CT negative (I do not have those records, need to review them myself), LP opening pressure was 50 however unsure of positioning (lateral decub?), 25 cc removed, started on diamox  bid.    Review of Systems: Patient complains of symptoms per HPI as well as the following  symptoms: headache, no SOB, no CP. Pertinent negatives per HPI. All others negative.   History   Social History  . Marital Status: Married    Spouse Name: N/A  . Number of Children: N/A  . Years of Education: N/A   Occupational History  . Not on file.   Social History Main Topics  . Smoking status: Never Smoker   . Smokeless tobacco: Never Used  . Alcohol Use: No  . Drug Use: No  . Sexual Activity: Not on file   Other Topics Concern    . Not on file   Social History Narrative   Caffeine use:  Occasional   Regular exercise:  No          Family History  Problem Relation Age of Onset  . Depression Mother   . Hypothyroidism Mother   . Hypertension Father   . COPD Maternal Grandfather   . Stroke Maternal Grandfather     mini strokes  . Stroke Paternal Grandmother     mini strokes  . Cancer Other     leukemia--maternal great aunt    Past Medical History  Diagnosis Date  . Headache   . Papilledema     Past Surgical History  Procedure Laterality Date  . Ankle surgery  2002    cyst removed from right ankle    Current Outpatient Prescriptions  Medication Sig Dispense Refill  . acetaZOLAMIDE (DIAMOX) 250 MG tablet Take 1 tablet (250 mg total) by mouth 3 (three) times daily. 270 tablet 0  . Cholecalciferol (VITAMIN D PO) Take 1 tablet by mouth daily.    . montelukast (SINGULAIR) 10 MG tablet Take 1 tablet by mouth daily.  5  . SYMBICORT 160-4.5 MCG/ACT inhaler Inhale 2 puffs into the lungs every 12 (twelve) hours.  1   No current facility-administered medications for this visit.    Allergies as of 08/31/2014  . (No Known Allergies)    Vitals: There were no vitals taken for this visit. Last Weight:  Wt Readings from Last 1 Encounters:  04/11/14 162 lb (73.483 kg)   Last Height:   Ht Readings from Last 1 Encounters:  04/11/14  (1.6 m)     Speech:  Speech is normal; fluent and spontaneous with normal comprehension.  Cognition:  The patient is oriented to person, place, and time;   recent and remote memory intact;   language fluent;   normal attention, concentration,   fund of knowledge Cranial Nerves:  The pupils are equal, round, and reactive to light. Bilateral papilledema. Visual fields are full to finger confrontation. Extraocular movements are intact, mild strabismus. Trigeminal sensation is intact and the muscles of mastication are normal. The face is symmetric.  The palate elevates in the midline. Voice is normal. Hearing intact. Shoulder shrug is normal. The tongue has normal motion without fasciculations.      Assessment/Plan:  29 year old female with bilateral papilledema and opening pressure of 50 here for new evaluation of Idiopathic Intracranial Hypertension (pseudotumor cerebri). She was started on diamox. She has new headache symptoms since the LP. Neuro exam significant for bilateral papilledema.  IIH: Worsening, increased headaches. Continue diamox. Can increase to  tid. If does not tolerate can consider adding lasix.  Vision: Needs follow up woth ophthalmology for VF testing  Will repeat MRI of the brain and orbits due to worsening papilledema   (previous Addendum: Received records from [facility]. Where she was seen in 02/2014. She presented with blurred and double  vision since December 4th, headache. MRi of the brain per report was normal. She was sent home with a referred to Opthalmology. Reviewed Opthalmology records, who identified papilledema bilat; significant disk edema and swelling with multiple peripapillary heme bilat and patient was sent directly to [place] ER for Lumbar Puncture.)  Naomie Dean, MD  [facility] [address] [place], [place] [address]  Phone [phone] Fax [phone]  A total of 30 minutes was spent face-to-face with this patient. Over half this time was spent on counseling patient on the IIH diagnosis and different diagnostic and therapeutic options available.

## 2014-10-05 ENCOUNTER — Ambulatory Visit: Payer: BLUE CROSS/BLUE SHIELD | Admitting: Neurology

## 2014-11-14 DIAGNOSIS — J309 Allergic rhinitis, unspecified: Secondary | ICD-10-CM | POA: Insufficient documentation

## 2014-11-14 DIAGNOSIS — K219 Gastro-esophageal reflux disease without esophagitis: Secondary | ICD-10-CM | POA: Insufficient documentation

## 2014-11-14 DIAGNOSIS — J4540 Moderate persistent asthma, uncomplicated: Secondary | ICD-10-CM | POA: Insufficient documentation

## 2015-04-02 ENCOUNTER — Ambulatory Visit (INDEPENDENT_AMBULATORY_CARE_PROVIDER_SITE_OTHER): Payer: BLUE CROSS/BLUE SHIELD | Admitting: Pulmonary Disease

## 2015-04-02 ENCOUNTER — Encounter: Payer: Self-pay | Admitting: Pulmonary Disease

## 2015-04-02 VITALS — BP 132/88 | HR 93 | Ht 63.0 in | Wt 165.4 lb

## 2015-04-02 DIAGNOSIS — K219 Gastro-esophageal reflux disease without esophagitis: Secondary | ICD-10-CM | POA: Diagnosis not present

## 2015-04-02 DIAGNOSIS — J4541 Moderate persistent asthma with (acute) exacerbation: Secondary | ICD-10-CM

## 2015-04-02 MED ORDER — PANTOPRAZOLE SODIUM 40 MG PO TBEC: 40.0000 mg | DELAYED_RELEASE_TABLET | Freq: Every day | ORAL | Status: DC

## 2015-04-02 NOTE — Assessment & Plan Note (Signed)
Take protonix once daily x 30 x 2 refills

## 2015-04-02 NOTE — Assessment & Plan Note (Addendum)
Your 'asthma' may be triggered by sinuses or reflux Limited Ct scan of sinuses - based on this, you may need ENT referral  Stay on Qvar & albuterol  Call if breathing/ wheezing worse  if worse, we'll stepup to steroid plus LABA  Combination and consider self administration of prednisone

## 2015-04-02 NOTE — Patient Instructions (Signed)
Your 'asthma' may be triggered by sinuses or reflux Limited Ct scan of sinuses - based on this, you may need ENT referral  Take protonix once daily x 30 x 2 refills Stay on Qvar & albuterol  Call if breathing/ wheezing worse

## 2015-04-02 NOTE — Progress Notes (Signed)
Subjective:    Patient ID: Christina Harmon, female    DOB: 1985-09-29, 30 y.o.   MRN: [id number]  HPI    30 year old never smoker presents for evaluation of ' asthma'.  She was diagnosed in 2015  When she had frequent bouts of wheezing and dyspnea. She reports 6-7 episodes of bronchitis that year requiring prednisone tapers. She was then placed on Symbicort and Singulair with significant relief.  She was diagnosed with pseudotumor cerebri in 03/2014 after a spinal tap and placed on Diamox.  She felt that this was related to her inhalers and stopped taking them. She had no problems during the summer  But had a flareup in December requiring an urgent care visit- she was given Qvar with significant improvement again.  She reports frequent use of albuterol for rescue. She denies nocturnal wheezing. She reports dyspnea on over exertion.  She is accompanied by her mother. No symptoms of asthma or wheezing as a child and was able to play various sports.  She reports frequent nasal congestion and postnasal drip. She also reports frequent heartburn for which she is popping Tums.   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  she reports being allergy tested- was positive to mold   She reports frequent retro-orbital headaches for which she has been taking Aleve.  She was seen by neurology in 08/2014 and has not returned for follow-up imaging studies.  Past Medical History  Diagnosis Date  . Headache   . Papilledema     Past Surgical History  Procedure Laterality Date  . Ankle surgery  2002    cyst removed from right ankle   No Known Allergies  Social History   Social History  . Marital Status: Married    Spouse Name: N/A  . Number of Children: N/A  . Years of Education: N/A   Occupational History  . Not on file.   Social History Main Topics  . Smoking status: Never Smoker   . Smokeless tobacco: Never Used  . Alcohol Use: No  . Drug Use: No    . Sexual Activity: Not on file   Other Topics Concern  . Not on file   Social History Narrative   Caffeine use:  Occasional   Regular exercise:  No          Family History  Problem Relation Age of Onset  . Depression Mother   . Hypothyroidism Mother   . Hypertension Father   . COPD Maternal Grandfather   . Stroke Maternal Grandfather     mini strokes  . Stroke Paternal Grandmother     mini strokes  . Cancer Other     leukemia--maternal great aunt     Review of Systems  Constitutional: Negative.  Negative for fever and unexpected weight change.  HENT: Positive for congestion, postnasal drip, rhinorrhea and sinus pressure. Negative for dental problem, ear pain, nosebleeds, sneezing, sore throat and trouble swallowing.   Eyes: Positive for pain. Negative for redness and itching.  Respiratory: Positive for cough, shortness of breath and wheezing. Negative for chest tightness.   Cardiovascular: Negative.  Negative for palpitations and leg swelling.  Gastrointestinal: Negative.  Negative for nausea and vomiting.  Endocrine: Negative.   Genitourinary: Negative.  Negative for dysuria.  Musculoskeletal: Negative.  Negative for joint swelling.  Skin: Negative.  Negative for rash.  Allergic/Immunologic: Negative.   Neurological: Positive for headaches.  Hematological: Negative.  Does not bruise/bleed  easily.  Psychiatric/Behavioral: Negative.  Negative for dysphoric mood. The patient is not nervous/anxious.        Objective:   Physical Exam   Gen. Pleasant, well-nourished, in no distress, normal affect ENT - left nasal polyp, no post nasal drip Neck: No JVD, no thyromegaly, no carotid bruits Lungs: no use of accessory muscles, no dullness to percussion, clear without rales or rhonchi  Cardiovascular: Rhythm regular, heart sounds  normal, no murmurs or gallops, no peripheral edema Abdomen: soft and non-tender, no hepatosplenomegaly, BS normal. Musculoskeletal: No  deformities, no cyanosis or clubbing Neuro:  alert, non focal        Assessment & Plan:

## 2015-04-05 ENCOUNTER — Ambulatory Visit (INDEPENDENT_AMBULATORY_CARE_PROVIDER_SITE_OTHER)
Admission: RE | Admit: 2015-04-05 | Discharge: 2015-04-05 | Disposition: A | Discharge status: Home / Self Care | Payer: BLUE CROSS/BLUE SHIELD | Source: Ambulatory Visit | Attending: Pulmonary Disease | Admitting: Pulmonary Disease

## 2015-04-05 DIAGNOSIS — J4541 Moderate persistent asthma with (acute) exacerbation: Secondary | ICD-10-CM

## 2015-04-05 DIAGNOSIS — K219 Gastro-esophageal reflux disease without esophagitis: Secondary | ICD-10-CM

## 2015-04-10 ENCOUNTER — Telehealth: Payer: Self-pay | Admitting: *Deleted

## 2015-04-10 DIAGNOSIS — J324 Chronic pansinusitis: Secondary | ICD-10-CM

## 2015-04-10 NOTE — Telephone Encounter (Signed)
-----   Message from Oretha Milch, MD sent at 04/09/2015  4:52 PM EST ----- She has pansinusitis ENT referral for opinion

## 2015-05-17 ENCOUNTER — Ambulatory Visit: Payer: BLUE CROSS/BLUE SHIELD | Admitting: Adult Health

## 2015-10-04 ENCOUNTER — Ambulatory Visit: Payer: BLUE CROSS/BLUE SHIELD | Admitting: Family Medicine

## 2016-02-21 ENCOUNTER — Other Ambulatory Visit: Payer: Self-pay | Admitting: Pulmonary Disease

## 2016-05-14 ENCOUNTER — Ambulatory Visit: Payer: BLUE CROSS/BLUE SHIELD | Admitting: Pulmonary Disease

## 2016-05-15 ENCOUNTER — Ambulatory Visit (INDEPENDENT_AMBULATORY_CARE_PROVIDER_SITE_OTHER): Payer: BLUE CROSS/BLUE SHIELD | Admitting: Internal Medicine

## 2016-05-15 ENCOUNTER — Other Ambulatory Visit (INDEPENDENT_AMBULATORY_CARE_PROVIDER_SITE_OTHER): Payer: BLUE CROSS/BLUE SHIELD

## 2016-05-15 ENCOUNTER — Encounter: Payer: Self-pay | Admitting: Internal Medicine

## 2016-05-15 DIAGNOSIS — J45901 Unspecified asthma with (acute) exacerbation: Secondary | ICD-10-CM | POA: Diagnosis not present

## 2016-05-15 DIAGNOSIS — J387 Other diseases of larynx: Secondary | ICD-10-CM | POA: Insufficient documentation

## 2016-05-15 LAB — CBC WITH DIFFERENTIAL/PLATELET
Basophils Absolute: 0.3 10*3/uL — ABNORMAL HIGH (ref 0.0–0.1)
Basophils Relative: 2.9 % (ref 0.0–3.0)
Eosinophils Absolute: 1.8 10*3/uL — ABNORMAL HIGH (ref 0.0–0.7)
HCT: 42.7 % (ref 36.0–46.0)
Hemoglobin: 14.2 g/dL (ref 12.0–15.0)
LYMPHS ABS: 2.6 10*3/uL (ref 0.7–4.0)
Lymphocytes Relative: 29 % (ref 12.0–46.0)
MCHC: 33.4 g/dL (ref 30.0–36.0)
MCV: 84.1 fl (ref 78.0–100.0)
MONO ABS: 0.5 10*3/uL (ref 0.1–1.0)
MONOS PCT: 6 % (ref 3.0–12.0)
NEUTROS ABS: 3.8 10*3/uL (ref 1.4–7.7)
NEUTROS PCT: 42.3 % — AB (ref 43.0–77.0)
PLATELETS: 355 10*3/uL (ref 150.0–400.0)
RBC: 5.08 Mil/uL (ref 3.87–5.11)
RDW: 14.9 % (ref 11.5–15.5)
WBC: 9 10*3/uL (ref 4.0–10.5)

## 2016-05-15 LAB — NITRIC OXIDE: NITRIC OXIDE: 127

## 2016-05-15 MED ORDER — PREDNISONE 10 MG PO TABS: ORAL_TABLET | ORAL | 0 refills | Status: DC

## 2016-05-15 MED ORDER — BUDESONIDE-FORMOTEROL FUMARATE 160-4.5 MCG/ACT IN AERO: 2.0000 | INHALATION_SPRAY | Freq: Two times a day (BID) | RESPIRATORY_TRACT | 6 refills | Status: DC

## 2016-05-15 NOTE — Assessment & Plan Note (Signed)
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 Will have to see how this pans out after acute asthma Rx

## 2016-05-15 NOTE — Progress Notes (Signed)
Subjective:     Patient ID: Christina Harmon, female   DOB: 1985-11-06, 31 y.o.   MRN: [id number]  HPI   OV 05/15/2016  Chief Complaint  Patient presents with  . Follow-up    Pt states she has constant SOB but more with activty for four months; getting worse. She also has dry cough for 6 weeks. 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 Possible chronic sinusitis 4 2017 January CT sinus. She is here for an acute visit. She tells me that she has a previous history of diagnosis of asthma for which she seen Dr. Vassie LollAlva and was informed that it is acid reflux although she recollects that taking Singulair did help her symptoms. She's currently not on any treatment for unspecified amount of time. Some 3-4 months. Noticing insidious onset of shortness of breath with exertion relieved by rest. Also made worse by pollen and dust and weather changes. Then in the last several weeks has had cough that is constant. She wakes up in the middle of the night chest tightness and wheezing. Some 3 weeks ago had the flu and she set a new lower never recovered from it. Symptoms are rated as severe. She had a boyfriend of frustrated by this.  In addition she also has chronic tickle in her throat, cough made worse by talking and laughing. Constant clearing of the throat  feno 127ppb and cw eos astma 05/15/2016   Results for Christina ParadiseBUSHEE, Zandria R (MRN [id number]) as of 05/15/2016 09:11  Ref. Range 12/08/2012 14:54 04/11/2014 12:38  Creatinine Latest Ref Range: 0.57 - 1.00 mg/dL 7.820.79 9.560.81   Results for Christina ParadiseBUSHEE, Adelisa R (MRN [id number]) as of 05/15/2016 09:11  Ref. Range 12/08/2012 14:54 04/11/2014 12:38  Hemoglobin Latest Ref Range: 12.0 - 15.0 g/dL 46.913.8     has a past medical history of Headache and Papilledema.   reports that she has never smoked. She has never used smokeless tobacco.  Past Surgical History:  Procedure Laterality Date  . ANKLE SURGERY   2002   cyst removed from right ankle    No Known Allergies  Immunization History  Administered Date(s) Administered  . HPV Quadrivalent 10/22/2009, 12/19/2009, 11/18/2010  . Influenza Whole 11/18/2010  . Influenza,inj,Quad PF,36+ Mos 12/08/2012  . Td 09/24/2009    Family History  Problem Relation Age of Onset  . Depression Mother   . Hypothyroidism Mother   . Hypertension Father   . COPD Maternal Grandfather   . Stroke Maternal Grandfather     mini strokes  . Stroke Paternal Grandmother     mini strokes  . Cancer Other     leukemia--maternal great aunt     Current Outpatient Prescriptions:  .  acetaZOLAMIDE (DIAMOX) 250 MG tablet, Take 1 tablet (250 mg total) by mouth 3 (three) times daily. (Patient taking differently: Take 250 mg by mouth 2 (two) times daily. ), Disp: 270 tablet, Rfl: 3 .  Albuterol Sulfate (PROAIR RESPICLICK) 108 (90 BASE) MCG/ACT AEPB, Inhale 2 puffs into the lungs every 4 (four) hours as needed. Reported on 04/02/2015, Disp: , Rfl:  .  Cholecalciferol (VITAMIN D PO), Take 1 tablet by mouth daily., Disp: , Rfl:  .  loratadine (CLARITIN) 10 MG tablet, Take 10 mg by mouth daily as needed for allergies., Disp: , Rfl:  .  beclomethasone (QVAR) 80 MCG/ACT inhaler, Inhale 2 puffs into the lungs 2 (two) times daily., Disp: , Rfl:  .  pantoprazole (PROTONIX) 40 MG tablet, 1 tablet by mouth daily.  NEEDS OFFICE VISIT FOR FURTHER REFILLS. (Patient not taking: Reported on 05/15/2016), Disp: 30 tablet, Rfl: 0   Review of Systems     Objective:   Physical Exam  Constitutional: She is oriented to person, place, and time. She appears well-developed and well-nourished. No distress.  obese  HENT:  Head: Normocephalic and atraumatic.  Right Ear: External ear normal.  Left Ear: External ear normal.  Mouth/Throat: Oropharynx is clear and moist. No oropharyngeal exudate.  retrognathia + Mild post nasal drip +  Eyes: Conjunctivae and EOM are normal. Pupils are equal,  round, and reactive to light. Right eye exhibits no discharge. Left eye exhibits no discharge. No scleral icterus.  Neck: Normal range of motion. Neck supple. No JVD present. No tracheal deviation present. No thyromegaly present.  Cardiovascular: Normal rate, regular rhythm, normal heart sounds and intact distal pulses.  Exam reveals no gallop and no friction rub.   No murmur heard. Pulmonary/Chest: Effort normal. No respiratory distress. She has wheezes. She has no rales. She exhibits no tenderness.  Diffuse scatterd lower airwy wheeze +  Abdominal: Soft. Bowel sounds are normal. She exhibits no distension and no mass. There is no tenderness. There is no rebound and no guarding.  Musculoskeletal: Normal range of motion. She exhibits no edema or tenderness.  Lymphadenopathy:    She has no cervical adenopathy.  Neurological: She is alert and oriented to person, place, and time. She has normal reflexes. No cranial nerve deficit. She exhibits normal muscle tone. Coordination normal.  Skin: Skin is warm and dry. No rash noted. She is not diaphoretic. No erythema. No pallor.  Psychiatric: She has a normal mood and affect. Her behavior is normal. Judgment and thought content normal.  Vitals reviewed.  Vitals:   05/15/16 0903  BP: (!) 132/92  Pulse: 89   Vitals:   05/15/16 0903  BP: (!) 132/92  Pulse: 89  SpO2: 93%  Weight: 164 lb 3.2 oz (74.5 kg)  Height: 5\' 3"  (1.6 m)    Estimated body mass index is 29.09 kg/m as calculated from the following:   Height as of this encounter: 5\' 3"  (1.6 m).   Weight as of this encounter: 164 lb 3.2 oz (74.5 kg).      Assessment:       ICD-9-CM ICD-10-CM   1. Asthma with acute exacerbation, unspecified asthma severity, unspecified whether persistent 493.92 J45.901        Plan:     Asthma with acute exacerbation Exhaled nitric oxide test is 127ppb and very abnormal That with your history and exam is c/w Acute asthma exacerbation  Plan =-  check IgE and cbc with diff - to help with future management of asthma - Start high dose symbicort 2 puff twice daily scheduled maintenance (like brushing your teeth) - use albuterol as needed - Take prednisone 40 mg daily x 2 days, then 20mg  daily x 2 days, then 10mg  daily x 2 days, then 5mg  daily x 2 days and stop    Followup - wil call with results  - in 6 weeks do Pre-bd and post bd spiro and dlco only. 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 Will have to see how this pans out after acute asthma Rx    Dr. Kalman Shan, M.D., [facility].C.P Pulmonary and Critical Care Medicine  Staff Physician [facility] Pager: [phone], If no answer or between  15:00h - 7:00h: call [phone]  05/15/2016 9:26 AM

## 2016-05-15 NOTE — Patient Instructions (Signed)
Asthma with acute exacerbation Exhaled nitric oxide test is 127ppb and very abnormal That with your history and exam is c/w Acute asthma exacerbation  Plan =- check IgE and cbc with diff - to help with future management of asthma - Start high dose symbicort 2 puff twice daily scheduled maintenance (like brushing your teeth) - use albuterol as needed - Take prednisone 40 mg daily x 2 days, then 20mg  daily x 2 days, then 10mg  daily x 2 days, then 5mg  daily x 2 days and stop    Followup - wil call with results  - in 6 weeks do Pre-bd and post bd spiro and dlco only. No lung volume  - in 6 weeks return to see Dr Vassie LollAlva or Tammy/Sarah nurse practitioners

## 2016-05-15 NOTE — Assessment & Plan Note (Signed)
Exhaled nitric oxide test is 127ppb and very abnormal That with your history and exam is c/w Acute asthma exacerbation  Plan =- check IgE and cbc with diff - to help with future management of asthma - Start high dose symbicort 2 puff twice daily scheduled maintenance (like brushing your teeth) - use albuterol as needed - Take prednisone 40 mg daily x 2 days, then 20mg  daily x 2 days, then 10mg  daily x 2 days, then 5mg  daily x 2 days and stop    Followup - wil call with results  - in 6 weeks do Pre-bd and post bd spiro and dlco only. No lung volume  - in 6 weeks return to see Dr Vassie LollAlva or Tammy/Sarah nurse practitioners

## 2016-05-15 NOTE — Addendum Note (Signed)
Addended by: Sheran LuzEAST, Izaha Shughart K on: 05/15/2016 01:18 PM   Modules accepted: Orders

## 2016-05-16 LAB — IGE: IGE (IMMUNOGLOBULIN E), SERUM: 35 kU/L (ref <115)

## 2016-05-16 NOTE — Progress Notes (Signed)
Called and spoke to pt. Informed her of the results and recs per MR. Pt verbalized understanding and denied any further questions or concerns at this time.   

## 2016-06-26 ENCOUNTER — Ambulatory Visit (INDEPENDENT_AMBULATORY_CARE_PROVIDER_SITE_OTHER): Payer: BLUE CROSS/BLUE SHIELD | Admitting: Adult Health

## 2016-06-26 ENCOUNTER — Ambulatory Visit (INDEPENDENT_AMBULATORY_CARE_PROVIDER_SITE_OTHER): Payer: BLUE CROSS/BLUE SHIELD | Admitting: Pulmonary Disease

## 2016-06-26 ENCOUNTER — Encounter: Payer: Self-pay | Admitting: Adult Health

## 2016-06-26 ENCOUNTER — Other Ambulatory Visit: Payer: BLUE CROSS/BLUE SHIELD

## 2016-06-26 DIAGNOSIS — J45901 Unspecified asthma with (acute) exacerbation: Secondary | ICD-10-CM

## 2016-06-26 DIAGNOSIS — J4541 Moderate persistent asthma with (acute) exacerbation: Secondary | ICD-10-CM

## 2016-06-26 LAB — PULMONARY FUNCTION TEST
DL/VA % PRED: 116 %
DL/VA: 5.55 ml/min/mmHg/L
DLCO cor % pred: 121 %
DLCO cor: 29.04 ml/min/mmHg
DLCO unc % pred: 114 %
DLCO unc: 27.3 ml/min/mmHg
FEF 25-75 PRE: 2.42 L/s
FEF2575-%PRED-PRE: 70 %
FEV1-%Pred-Pre: 92 %
FEV1-Pre: 2.92 L
FEV1FVC-%Pred-Pre: 91 %
FEV6-%Pred-Pre: 102 %
FEV6-PRE: 3.81 L
FEV6FVC-%PRED-PRE: 101 %
FVC-%PRED-PRE: 101 %
FVC-PRE: 3.81 L
PRE FEV6/FVC RATIO: 100 %
Pre FEV1/FVC ratio: 77 %

## 2016-06-26 NOTE — Progress Notes (Signed)
PFT done today. 

## 2016-06-26 NOTE — Progress Notes (Signed)
  ID: Christina Harmon, female    DOB: 10-Jul-1985, 31 y.o.   MRN: [id number]  No chief complaint on file.   Referring provider: Sandford Craze, NP  HPI: 31 yo female never smoker followed for Asthma  Hx of retrognathia   TEST  FENO 05/2016 127  05/15/16 CBC w/ Diff >+ eos (1800) , IgE 35  PFT 06/26/16 Normal , FEV1 92% , ratio 77 , FVC 100%, DLCO nml  CT sinus 2017 >mucosal thickening , small amt of fluid , chronic sinusitis   06/26/2016 Follow up : Asthma Pt returns for a 1 month follow up for asthma . Seen last visit with difficult to control asthma sx . Her exhaled nitric oxide test was very high at 127. Eosinophils were high . Ige was ok at 25.  She was given prednisone taper and started on Symbicort. She is feeling much better. Wheezing and cough have resolved . No proair use x 2 weeks  PFT today shows normal lung function . Reviewed w/ pt and her husband.   Previously on Singulair , thinks it may have helped  But stopped due to side effects she did not tolerate well (unable to remember exact reason) .     No Known Allergies  Immunization History  Administered Date(s) Administered  . HPV Quadrivalent 10/22/2009, 12/19/2009, 11/18/2010  . Influenza Whole 11/18/2010  . Influenza,inj,Quad PF,36+ Mos 12/08/2012  . Td 09/24/2009    Past Medical History:  Diagnosis Date  . Headache   . Papilledema     Tobacco History: History  Smoking Status  . Never Smoker  Smokeless Tobacco  . Never Used   Counseling given: Not Answered   Outpatient Encounter Prescriptions as of 06/26/2016  Medication Sig  . acetaZOLAMIDE (DIAMOX) 250 MG tablet Take 1 tablet (250 mg total) by mouth 3 (three) times daily. (Patient taking differently: Take 250 mg by mouth 2 (two) times daily. )  . Albuterol Sulfate (PROAIR RESPICLICK) 108 (90 BASE) MCG/ACT AEPB Inhale 2 puffs into the lungs every 4 (four) hours as needed. Reported on 04/02/2015  . budesonide-formoterol (SYMBICORT) 160-4.5  MCG/ACT inhaler Inhale 2 puffs into the lungs 2 (two) times daily.  . Cholecalciferol (VITAMIN D PO) Take 1 tablet by mouth daily.  Marland Kitchen loratadine (CLARITIN) 10 MG tablet Take 10 mg by mouth daily as needed for allergies.  . pantoprazole (PROTONIX) 40 MG tablet 1 tablet by mouth daily.  NEEDS OFFICE VISIT FOR FURTHER REFILLS.  . [DISCONTINUED] predniSONE (DELTASONE) 10 MG tablet 40 mg daily x 2 days, then  daily x 2 days, then  daily x 2 days, then  daily x 2 days and stop  . [DISCONTINUED] beclomethasone (QVAR) 80 MCG/ACT inhaler Inhale 2 puffs into the lungs 2 (two) times daily.   No facility-administered encounter medications on file as of 06/26/2016.      Review of Systems  Constitutional:   No  weight loss, night sweats,  Fevers, chills, fatigue, or  lassitude.  HEENT:   No headaches,  Difficulty swallowing,  Tooth/dental problems, or  Sore throat,                No sneezing, itching, ear ache,  +nasal congestion, post nasal drip,   CV:  No chest pain,  Orthopnea, PND, swelling in lower extremities, anasarca, dizziness, palpitations, syncope.   GI  No heartburn, indigestion, abdominal pain, nausea, vomiting, diarrhea, change in bowel habits, loss of appetite, bloody stools.   Resp:  No wheezing.  No  chest wall deformity  Skin: no rash or lesions.  GU: no dysuria, change in color of urine, no urgency or frequency.  No flank pain, no hematuria   MS:  No joint pain or swelling.  No decreased range of motion.  No back pain.    Physical Exam  BP 124/78 (BP Location: Right Arm, Cuff Size: Normal)   Pulse 85   Ht 5' 3.75" (1.619 m)   Wt 168 lb 9.6 oz (76.5 kg)   SpO2 99%   BMI 29.17 kg/m   GEN: A/Ox3; pleasant , NAD    HEENT:  [place]/AT,  EACs-clear, TMs-wnl, NOSE-clear drainage, THROAT-clear, no lesions, no postnasal drip or exudate noted.   NECK:  Supple w/ fair ROM; no JVD; normal carotid impulses w/o bruits; no thyromegaly or nodules palpated; no lymphadenopathy.     RESP  Clear  P & A; w/o, wheezes/ rales/ or rhonchi. no accessory muscle use, no dullness to percussion  CARD:  RRR, no m/r/g, no peripheral edema, pulses intact, no cyanosis or clubbing.  GI:   Soft & nt; nml bowel sounds; no organomegaly or masses detected.   Musco: Warm bil, no deformities or joint swelling noted.   Neuro: alert, no focal deficits noted.    Skin: Warm, no lesions or rashes    Lab Results:   BNP No results found for: BNP  ProBNP No results found for: PROBNP  Imaging: No results found.   Assessment & Plan:   No problem-specific Assessment & Plan notes found for this encounter.     Rubye Oaks, NP 06/26/2016

## 2016-06-26 NOTE — Patient Instructions (Addendum)
Continue on Symbicort 2 puffs Twice daily  , rinse after use.  Continue on Allegra daily .  Use ProAir 2 puffs every 4hr as needed, this is your rescue inhaler .  If you decide to get pregnant , will need to look at different options.  follow up Dr. Vassie Loll  In 3 months and As needed

## 2016-06-27 NOTE — Assessment & Plan Note (Signed)
Recent flare now resolving - PFT show normal lung function  She has significantly elevated eosinophils , may need to look at additonal therapy going forward but for now very well controlled on Symbicort and Claritin .  Recheck cbc w/ diff to look at eos Pt education on preg cat of asthma meds.    Plan  Patient Instructions  Continue on Symbicort 2 puffs Twice daily  , rinse after use.  Continue on Allegra daily .  Use ProAir 2 puffs every 4hr as needed, this is your rescue inhaler .  If you decide to get pregnant , will need to look at different options.  follow up Dr. Vassie Loll  In 3 months and As needed

## 2016-07-07 NOTE — Progress Notes (Signed)
Reviewed & agree with plan  

## 2016-09-18 ENCOUNTER — Telehealth: Payer: Self-pay | Admitting: Pulmonary Disease

## 2016-09-18 ENCOUNTER — Ambulatory Visit: Payer: BLUE CROSS/BLUE SHIELD | Admitting: Pulmonary Disease

## 2016-09-18 NOTE — Telephone Encounter (Signed)
ATC pt x1 phone rung one time and went straight to vm but vm has not been set up

## 2016-09-18 NOTE — Telephone Encounter (Signed)
Pt states that she cannot afford the Symbicort at $300/mo Pt is needing an alternative.  Pt states that she called her insurance and they told her there is no alternative for this medication.  Insurance: [employer] [phone]  Spoke with Crist Infanteia - states that the Symbicort 160 is covered without PA needed but apparently has high co-pay.  No alternatives provided or available per formulary.  Please advise Dr Vassie LollAlva. Thanks.

## 2016-09-18 NOTE — Telephone Encounter (Signed)
Pt returned phone call..ert ° ° °

## 2016-09-19 NOTE — Telephone Encounter (Signed)
Seems like her asthma has worsened over last few months & symbicort type (steroid/laba)medication has really helped. This would be ideal Can check on steroid only inhaler such as flovent to see if that is any cheaper (but would be less ideal) If covered better , OK for flovent 110 2 puffs bid

## 2016-09-22 MED ORDER — FLUTICASONE PROPIONATE HFA 110 MCG/ACT IN AERO: 2.0000 | INHALATION_SPRAY | Freq: Two times a day (BID) | RESPIRATORY_TRACT | 4 refills | Status: DC

## 2016-09-22 NOTE — Telephone Encounter (Signed)
Spoke with patient. She is aware of the change and wants to proceed with change. Verified pharmacy. RX will be sent in today. She verbalized understanding. Nothing else needed at time of call.

## 2016-09-22 NOTE — Telephone Encounter (Signed)
OK for flovent 110 2 puffs bid Asked her to call if symptoms worsen

## 2016-09-22 NOTE — Telephone Encounter (Signed)
Called pt's insurance, flovent copay is a tier 2 inhaler and would be her cheapest option as far as inhalers.  Rep was unable to tell me what the exact copay is.  Rep also states that we could try to initiate a tier exception, but advised that if pt had not tried and failed tier 2 options then it would be likely that symbicort tier exception would not go through.    RA would you like to switch rx to flovent?  Thanks!

## 2017-05-14 ENCOUNTER — Ambulatory Visit (INDEPENDENT_AMBULATORY_CARE_PROVIDER_SITE_OTHER): Payer: BLUE CROSS/BLUE SHIELD | Admitting: Pulmonary Disease

## 2017-05-14 ENCOUNTER — Encounter: Payer: Self-pay | Admitting: Pulmonary Disease

## 2017-05-14 DIAGNOSIS — J324 Chronic pansinusitis: Secondary | ICD-10-CM

## 2017-05-14 DIAGNOSIS — J4541 Moderate persistent asthma with (acute) exacerbation: Secondary | ICD-10-CM

## 2017-05-14 DIAGNOSIS — J329 Chronic sinusitis, unspecified: Secondary | ICD-10-CM | POA: Insufficient documentation

## 2017-05-14 MED ORDER — BUDESONIDE-FORMOTEROL FUMARATE 160-4.5 MCG/ACT IN AERO: 2.0000 | INHALATION_SPRAY | Freq: Two times a day (BID) | RESPIRATORY_TRACT | 0 refills | Status: DC

## 2017-05-14 NOTE — Assessment & Plan Note (Signed)
Continue Claritin, consider decongestant if symptoms worse

## 2017-05-14 NOTE — Assessment & Plan Note (Signed)
Sample of Symbicort 160-2 puffs twice daily, rinse mouth after use -this is your maintenance medication Use albuterol as needed only   Alternatives for steroid inhaler include -Qvar, Pulmicort or Flovent.  Find cost and get back to us and we will send an appropriate prescription  We discussed the difference between maintenance and rescue inhaler. Can consider Singulair but doubt that this will control her symptoms as monotherapy

## 2017-05-14 NOTE — Patient Instructions (Signed)
Sample of Symbicort 160-2 puffs twice daily, rinse mouth after use -this is your maintenance medication Use albuterol as needed only   Alternatives for steroid inhaler include -Qvar, Pulmicort or Flovent.  Find cost and get back to us and we will send an appropriate prescription

## 2017-05-14 NOTE — Progress Notes (Signed)
   Subjective:    Patient ID: Denice Paradiseshley R Mcintire, female    DOB: November 29, 1985, 32 y.o.   MRN: [id number]  HPI  32 yo female never smoker for follow-up of asthma, onset around 2015 She does have sinusitis and GERD Hx of retrognathia and pseudotumor cerebri  She was seen in 2017 and placed on Symbicort, she was last seen 06/2016, but ran out of Symbicort around October, found this to be very expensive, we gave her an alternative Flovent but this was expensive doing her insurance and she was off medications except albuterol for the last few months. She had a chest cold about 2 weeks ago and got increasingly short of breath with wheezing and received a prednisone taper at urgent care which she has just finished 3 days ago and is feeling back to normal. She reports occasional postnasal drip and sinus symptoms for which she takes over-the-counter Claritin, sinus rinse  Significant tests/ events reviewed  FENO 05/2016 127  05/15/16 CBC w/ Diff >+ eos (1800) , IgE 35  PFT 06/26/16 Normal , FEV1 92% , ratio 77 , FVC 100%, DLCO nml  CT sinus 2017 >mucosal thickening , small amt of fluid , chronic sinusitis   Review of Systems neg for any significant sore throat, dysphagia, itching, sneezing, nasal congestion or excess/ purulent secretions, fever, chills, sweats, unintended wt loss, pleuritic or exertional cp, hempoptysis, orthopnea pnd or change in chronic leg swelling. Also denies presyncope, palpitations, heartburn, abdominal pain, nausea, vomiting, diarrhea or change in bowel or urinary habits, dysuria,hematuria, rash, arthralgias, visual complaints, headache, numbness weakness or ataxia.     Objective:   Physical Exam   Gen. Pleasant, well-nourished, in no distress ENT - no thrush, no post nasal drip Neck: No JVD, no thyromegaly, no carotid bruits Lungs: no use of accessory muscles, no dullness to percussion, clear without rales or rhonchi  Cardiovascular: Rhythm regular, heart sounds  normal, no  murmurs or gallops, no peripheral edema Musculoskeletal: No deformities, no cyanosis or clubbing         Assessment & Plan:

## 2017-05-14 NOTE — Addendum Note (Signed)
Addended by: Wyvonne LenzPINION, Lillia Lengel P on: 05/14/2017 12:47 PM   Modules accepted: Orders

## 2017-06-02 ENCOUNTER — Encounter: Payer: Self-pay | Admitting: Pulmonary Disease

## 2017-06-02 NOTE — Telephone Encounter (Signed)
Received the following message from patient:   ----- Message -----  From: Denice ParadiseAshley R Machorro  Sent: 06/02/2017 11:30 AM EDT  To: Comer Locketakesh V. Vassie LollAlva, MD Subject: Non-Urgent Medical Question  Called about pulmicort. Insurance company want's to know whatdose for [employer] fees.  Patient was seen recently and was advised to contact insurance to find out the prices for Pulmicort. She is requesting the dosage for cost reasons.   RA, please advise. Thanks!

## 2017-07-02 IMAGING — CT CT PARANASAL SINUSES LIMITED
1 of 2 series · 8 of 11 positions shown, 10 images · non-contrast
Comparison: Head CT and MRI 03/01/2014

CLINICAL DATA: Asthma. Gastroesophageal reflux disease. Chronic
cough for 4 months. Pressure behind the right eye. Drainage.

EXAM:
CT PARANASAL SINUS LIMITED WITHOUT CONTRAST
TECHNIQUE: Non-contiguous multidetector CT images of the paranasal sinuses were
obtained in a single plane without contrast.

[Series 4: limited sinus st · axial · 0.25mm/px · z∈[+105,+175]mm · 8 of 10 slices shown, 10 images]
[im 2/10  brain]
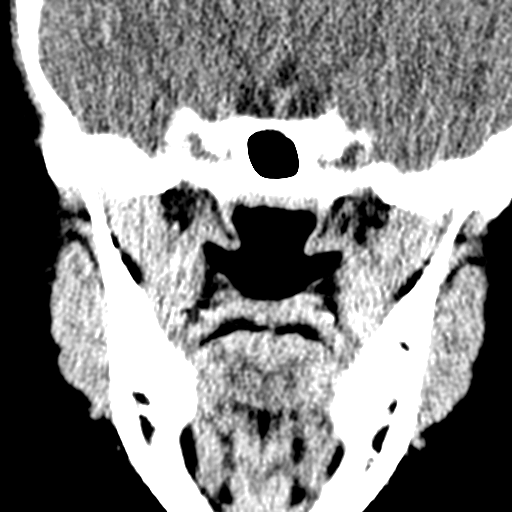
[im 2/10  bone]
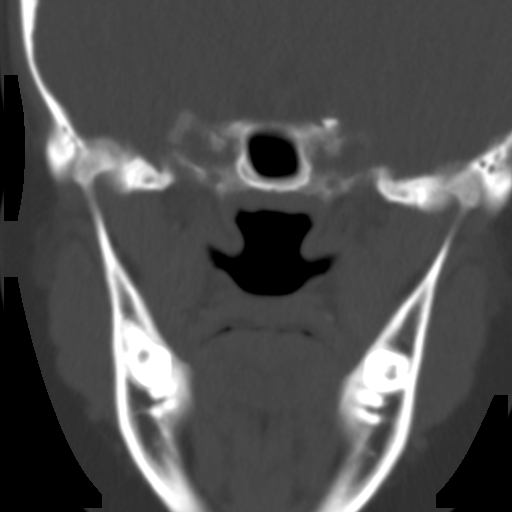
[im 3/10  bone]
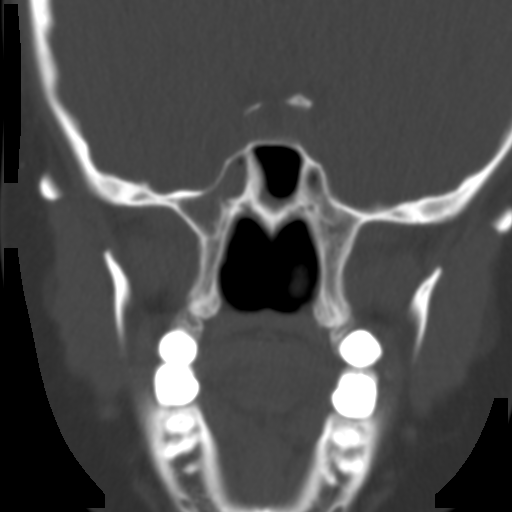
[im 4/10  bone]
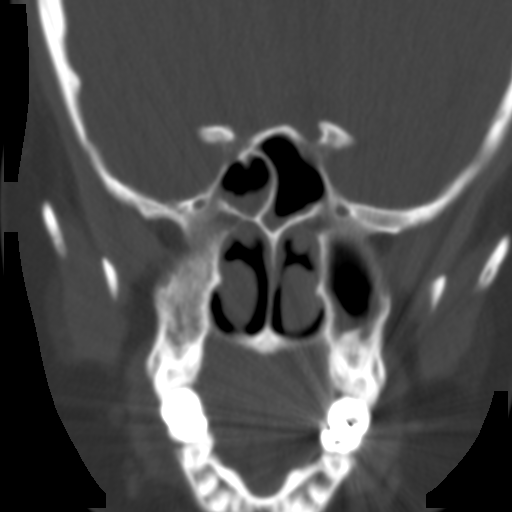
[im 5/10  bone]
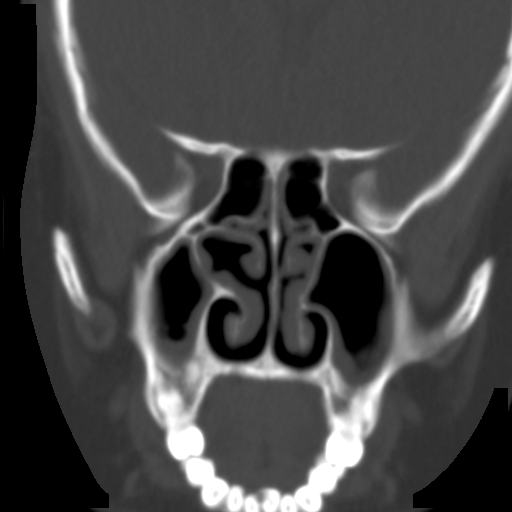
[im 6/10  brain]
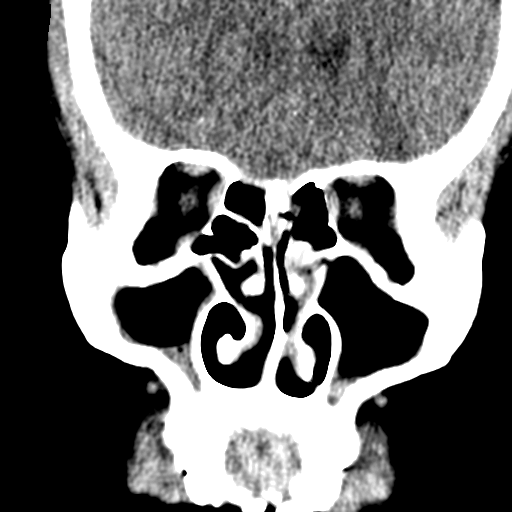
[im 6/10  bone]
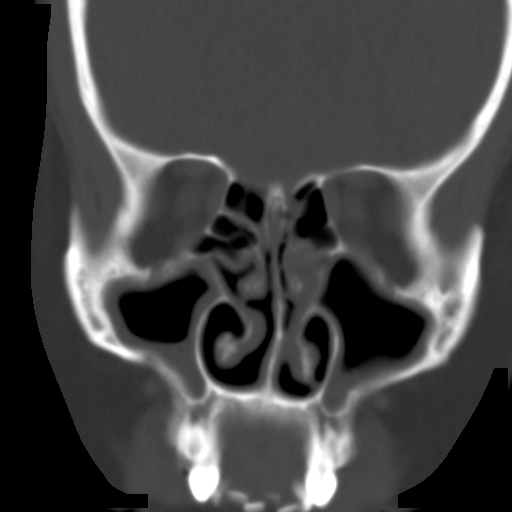
[im 7/10  bone]
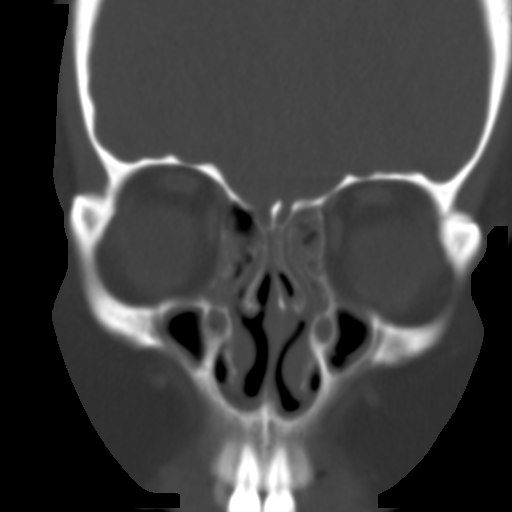
[im 8/10  bone]
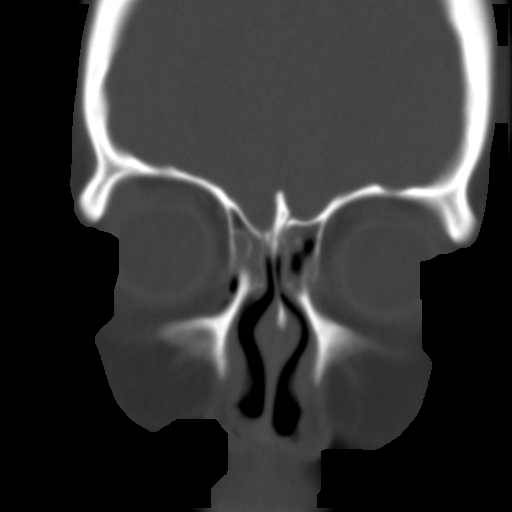
[im 9/10  bone]
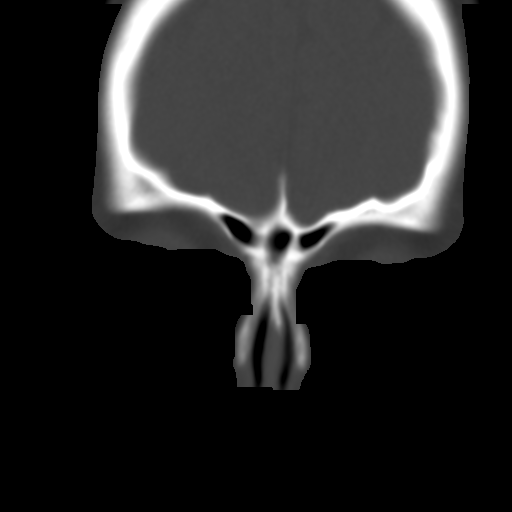

[8 of 11 positions shown; findings below may reference images not displayed]

FINDINGS: The frontal sinuses are small with evidence of mucosal thickening in
the regions of the frontal recesses, greater on the right. Subtotal
opacification of the anterior ethmoid air cells is again seen
bilaterally. There is mild circumferential mucosal thickening in the
maxillary sinuses which is has mildly increased from the prior MRI.
There is a small amount of fluid in the left maxillary sinus. There
is mild bilateral sphenoid sinus mucosal thickening with possible
trace fluid on the left.

There is likely obstruction of the ostiomeatal complexes
bilaterally, however evaluation is limited by non-contiguous imaging
technique. There is rightward nasal septal deviation. Regional soft
tissues are grossly unremarkable.
IMPRESSION: Moderate bilateral paranasal sinus mucosal thickening, greatest in
the ethmoid air cells. Small amount of fluid may reflect acute on
chronic sinusitis.

## 2017-11-20 ENCOUNTER — Ambulatory Visit: Payer: BLUE CROSS/BLUE SHIELD | Admitting: Adult Health

## 2018-05-26 ENCOUNTER — Institutional Professional Consult (permissible substitution): Payer: Self-pay | Admitting: Neurology

## 2018-05-27 ENCOUNTER — Encounter: Payer: Self-pay | Admitting: Neurology

## 2018-05-27 ENCOUNTER — Encounter

## 2018-05-27 ENCOUNTER — Other Ambulatory Visit: Payer: Self-pay

## 2018-05-27 ENCOUNTER — Ambulatory Visit (INDEPENDENT_AMBULATORY_CARE_PROVIDER_SITE_OTHER): Payer: BLUE CROSS/BLUE SHIELD | Admitting: Neurology

## 2018-05-27 ENCOUNTER — Telehealth: Payer: Self-pay | Admitting: Neurology

## 2018-05-27 VITALS — BP 131/79 | HR 73 | Ht 63.0 in | Wt 164.0 lb

## 2018-05-27 DIAGNOSIS — H532 Diplopia: Secondary | ICD-10-CM

## 2018-05-27 DIAGNOSIS — R51 Headache with orthostatic component, not elsewhere classified: Secondary | ICD-10-CM

## 2018-05-27 DIAGNOSIS — H4710 Unspecified papilledema: Secondary | ICD-10-CM | POA: Diagnosis not present

## 2018-05-27 DIAGNOSIS — G08 Intracranial and intraspinal phlebitis and thrombophlebitis: Secondary | ICD-10-CM

## 2018-05-27 DIAGNOSIS — E669 Obesity, unspecified: Secondary | ICD-10-CM

## 2018-05-27 DIAGNOSIS — H53453 Other localized visual field defect, bilateral: Secondary | ICD-10-CM | POA: Diagnosis not present

## 2018-05-27 DIAGNOSIS — R29818 Other symptoms and signs involving the nervous system: Secondary | ICD-10-CM

## 2018-05-27 DIAGNOSIS — Z6830 Body mass index (BMI) 30.0-30.9, adult: Secondary | ICD-10-CM

## 2018-05-27 DIAGNOSIS — G4489 Other headache syndrome: Secondary | ICD-10-CM

## 2018-05-27 DIAGNOSIS — G932 Benign intracranial hypertension: Secondary | ICD-10-CM | POA: Diagnosis not present

## 2018-05-27 MED ORDER — ACETAZOLAMIDE ER 500 MG PO CP12: 500.0000 mg | ORAL_CAPSULE | Freq: Two times a day (BID) | ORAL | 6 refills | Status: DC

## 2018-05-27 NOTE — Progress Notes (Addendum)
[facility]    Provider:  Dr Lucia Gaskins Referring Provider: Sandford Craze, NP Primary Care Physician:  Sandford Craze, NP CC: Pseudotumor cerebri  HPI: Patient is her at the request of Sandford Craze. She has a history of IIH and has not been seen here for almost 4 years. She felt she was doing well these past years and stopped the Diamox. She has been worsening for a year. Daily headaches. Blurry vision. Also she will have double vision if she is very tired. Decreased peripheral vision bilaterally which is continuous. Continuous headache. Headache can be pulsating, worse with movement. She has light sensitivity. No nausea or vomiting.  No IUD or intrauterine birth control, no birth control. Started worsening with new job, she has to be at work at [employer] and husband reports she does not sleep well. Husband provides much information. Lights are bright at work which worsens it. She doesn't fluctuate with weight, she does not feel she has gained or lost.  She has a follow up appointment later this month with her eye doctor. Headaches are positionally worse. No other focal neurologic deficits, associated symptoms, inciting events or modifiable factors. Here with husband who also provides much information.  Reviewed notes from [facility].  Patient was diagnosed with papilledema in 2015, she had onset of diplopia at that time in December, was seen at his urine care,.  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  She was diagnosed with likely IIA H, patient was sent directly to [facility] emergency room and a lumbar puncture was performed.  Opening pressure was greater than 50.  She was started on Diamox and did well, in the past she had stopped it on her own.    Reviewed notes from Eric blocked OD, OD full range, pupils normal, APD negative, visual fields full to confrontation, she is 20/20 OD and 20/20 OS with correction, slit-lamp exam was normal, funduscopic  exam normal however the optic nerve did show papilledema.  Return if significant papilledema, significant swelling noted, however patient is relatively asymptomatic.  Papilledema associated with increased intracranial pressure.  Significant swelling of optic nerve heads.  Patient was lost to follow-up.  Review of Systems: Patient complains of symptoms per HPI as well as the following symptoms: headache, diplopia, no CP, no SOB . Pertinent negatives per HPI. All others negative.  Interval History 08/31/2014: Patient is here for follow up of IIH. She stopped the Actezolamide and started having problems again. Then restarted it 250mg  twice a day about a month ago due to worsening headache which helped but 3-4 days ago it started getting worse and increased to 3x a day. Headaches are worsening. She is having the headaches and it may be sinuses, they are positional worse with bending down, was having them once a week but recently 3 days in a row. Lasting 30 minutes to an hour and responds to ibuprofen. In the frontal rea with pressure. She has blurry vision again and diplopia.   Interval History 04/11/2014: Patient is here for follow up of IIH. She is here with her mother. She is doing great, vision is improving. She can see in her peripheral fields now. She still has diplopia but that is improving. No headache. She has not been back to see opthalmologist. Having some tingling in the fingers and urinating a lot but otherwise is doing well with the medication. She is very happy. We discussed IIH (ie pseudotumor) at length with mother and daughter, its causes, diagnosis and  treatment and future treatment plan.   Review of Systems: Patient complains of symptoms per HPI as well as the following symptoms: diplopia, no CP, no SOB . Pertinent negatives per HPI. All others negative.   Initial visit 03/08/14: Christina Harmon is a 33 y.o. female here as a referral from Dr. Peggyann Juba for Pseudotumor cerebri  Has had  headaches all her life. She is having double vision since December 4th. She was admitted to [facility] for diplopia and MRI of the brain and cat scan were both normal (per patient, no records). She went to an eye doctor who sent her to [facility] for papilledema. Opening pressure was 50. She was started on Diamox  twice daily just a few days ago. She has pounding pressure behind the eyes. Can be 10/10 pain. She started having these headaches on December 9th/10th. . She saw Dr. Precious Bard, ophthalmologist and she reports vision is fine (do not have records) but he noticed the papilledema and sent her to [facility]. The headache today is 8/10 but it feels different, worse when sitting up and better when laying down - has had this type of headache since the lumbar puncture. She feels like she is going to pass out right now. She has light sensitivity, sound sensitivity. Light triggers the headaches. Having them daily. Up to 8 hours a day. If she can sit in a dark room she feels better. She felt better after the large volume lumbar tap. She just started the diamox 2 days ago. No hearing changes. No visual obscurations. She tried topamax in the past and it did not help her migraines.   Reviewed notes, labs and imaging from outside physicians, which showed: CSF cell count and diff, protein, glucose, gram stain unremarkable. CMP/BMP/TSH all WNL. Reviewed notes from the ED which showed: on the 28th she presented for bilat pappiledema. They did receive records from [place] and reviewed, MRI and CT negative (I do not have those records, need to review them myself), LP opening pressure was 50 however unsure of positioning (lateral decub?), 25 cc removed, started on diamox  bid.    Review of Systems: Patient complains of symptoms per HPI as well as the following symptoms: headache, vision loss no SOB, no CP. Pertinent negatives per HPI. All others negative.   Social History   Socioeconomic History   Marital  status: Significant Other    Spouse name: Not on file   Number of children: Not on file   Years of education: Not on file   Highest education level: High school graduate  Occupational History   Not on file  Social Needs   Financial resource strain: Not on file   Food insecurity:    Worry: Not on file    Inability: Not on file   Transportation needs:    Medical: Not on file    Non-medical: Not on file  Tobacco Use   Smoking status: Never Smoker   Smokeless tobacco: Never Used  Substance and Sexual Activity   Alcohol use: Never    Frequency: Never   Drug use: Never   Sexual activity: Not on file  Lifestyle   Physical activity:    Days per week: Not on file    Minutes per session: Not on file   Stress: Not on file  Relationships   Social connections:    Talks on phone: Not on file    Gets together: Not on file    Attends religious service: Not on file  Active member of club or organization: Not on file    Attends meetings of clubs or organizations: Not on file    Relationship status: Not on file   Intimate partner violence:    Fear of current or ex partner: Not on file    Emotionally abused: Not on file    Physically abused: Not on file    Forced sexual activity: Not on file  Other Topics Concern   Not on file  Social History Narrative   Caffeine use:  1 cup of coffee daily & 1-2 soft drinks per day   Regular exercise:  No   Right handed   Lives at home with fiance Vonna KotykJay    Family History  Problem Relation Age of Onset   Depression Mother    Hypothyroidism Mother    COPD Mother    Hypertension Father    COPD Maternal Grandfather    Stroke Maternal Grandfather        mini strokes   Stroke Paternal Grandmother        mini strokes   Cancer Other        leukemia--maternal great aunt   Brain cancer Paternal Grandfather    Pseudotumor cerebri Neg Hx     Past Medical History:  Diagnosis Date   Asthma    Headache    Migraine      Papilledema     Past Surgical History:  Procedure Laterality Date   ANKLE SURGERY  2002   cyst removed from right ankle    Current Outpatient Medications  Medication Sig Dispense Refill   Albuterol Sulfate (PROAIR RESPICLICK) 108 (90 BASE) MCG/ACT AEPB Inhale 2 puffs into the lungs every 4 (four) hours as needed. Reported on 04/02/2015     loratadine (CLARITIN) 10 MG tablet Take 10 mg by mouth daily as needed for allergies.     acetaZOLAMIDE (DIAMOX) 500 MG capsule Take 1 capsule (500 mg total) by mouth 2 (two) times daily. 60 capsule 6   budesonide-formoterol (SYMBICORT) 160-4.5 MCG/ACT inhaler Inhale 2 puffs into the lungs 2 (two) times daily. (Patient not taking: Reported on 05/27/2018) 1 Inhaler 0   No current facility-administered medications for this visit.     Allergies as of 05/27/2018   (No Known Allergies)    Vitals: BP 131/79 (BP Location: Right Arm, Patient Position: Sitting)    Pulse 73    Ht 5\' 3"  (1.6 m)    Wt 164 lb (74.4 kg)    BMI 29.05 kg/m  Last Weight:  Wt Readings from Last 1 Encounters:  05/27/18 164 lb (74.4 kg)   Last Height:   Ht Readings from Last 1 Encounters:  05/27/18 5\' 3"  (1.6 m)   Physical exam: Exam: Gen: NAD, conversant, well nourised, overweight, well groomed                     CV: RRR, no MRG. No Carotid Bruits. No peripheral edema, warm, nontender Eyes: Conjunctivae clear without exudates or hemorrhage  Neuro: Detailed Neurologic Exam  Speech:    Speech is normal; fluent and spontaneous with normal comprehension.  Cognition:    The patient is oriented to person, place, and time;     recent and remote memory intact;     language fluent;     normal attention, concentration,     fund of knowledge Cranial Nerves:    The pupils are equal, round, and reactive to light. 1+ papilledema. Visual fields are full to finger confrontation.  Extraocular movements are intact. Trigeminal sensation is intact and the muscles of  mastication are normal. The face is symmetric. The palate elevates in the midline. Hearing intact. Voice is normal. Shoulder shrug is normal. The tongue has normal motion without fasciculations.   Coordination:    Normal finger to nose and heel to shin. Normal rapid alternating movements.   Gait:    Heel-toe and tandem gait are normal.   Motor Observation:    No asymmetry, no atrophy, and no involuntary movements noted. Tone:    Normal muscle tone.    Posture:    Posture is normal. normal erect    Strength:    Strength is V/V in the upper and lower limbs.      Sensation: intact to LT     Reflex Exam:  DTR's:    Deep tendon reflexes in the upper and lower extremities are normal bilaterally.   Toes:    The toes are downgoing bilaterally.   Clonus:    Clonus is absent.    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 In the past her opening pressure was 50 but has been off of Diamox for almost 4 years. 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  Neuro exam significant for bilateral papilledema.  (Addendum 06/14/2018: Called and spoke to patient. MRI c/w IIH.  She has no headaches, no vision changes, feels improved pn low dose Diamox bid. Mild side effects like fatigue. She is on a very low dose, since she is improving let's stay on the low dose and follow up in May when we can look at fundus and consider increasing if needed. Start having symptoms again please call.   MRV: This MR venogram of the intracranial veins and sinuses does not show any sinus or venous occlusion.   There is mild stenosis in the mid transverse sinuses.  This is often seen in patients with idiopathic intracranial hypertension.  MRI brain: This MRI of the brain with and without contrast shows the following:1.    The pituitary gland is mildly reduced in height and the optic nerve sheaths are increased in diameter.  These findings  could be incidental or due to to elevated intracranial pressure as would be seen with idiopathic intracranial hypertension.    2.    There are some punctate T2/flair hyperintense foci in the subcortical white matter.  At least 1 of these was present on the 2015 MRI performed on a lower strength magnet.  These most likely represent sequela of migraine headaches for very minimal chronic microvascular ischemic changes. 3.    Mild maxillary and ethmoid chronic sinusitis, minimally improved compared to the 2015 MRI.)  Assessment and plan:  - MRI brain due to concerning symptoms of peripheral vision loss, positional headaches, diplopia,  papilledema to look for space occupying mass, chiari or intracranial hypertension (pseudotumor). Also needs MRV of the brain to evaluate for high-grade stenosis or venous thrombosis.)  - LP  - IIH: Worsening papilledema, increased headaches and diplopia. Start diamox  bid.   - Vision: Needs follow up woth ophthalmology for VF testing. Discussed risk of permanent vision loss.  F/u 3-4 weeks need to follow carefully.  Discussed weight and IIH, recommend losing 10% of body weight  Discussed: To prevent or relieve headaches, try the following: Cool Compress. Lie down and place a cool compress on your head.  Avoid headache triggers. If certain foods or odors seem to have triggered your migraines in  the past, avoid them. A headache diary might help you identify triggers.  Include physical activity in your daily routine. Try a daily walk or other moderate aerobic exercise.  Manage stress. Find healthy ways to cope with the stressors, such as delegating tasks on your to-do list.  Practice relaxation techniques. Try deep breathing, yoga, massage and visualization.  Eat regularly. Eating regularly scheduled meals and maintaining a healthy diet might help prevent headaches. Also, drink plenty of fluids.  Follow a regular sleep schedule. Sleep deprivation might  contribute to headaches Consider biofeedback. With this mind-body technique, you learn to control certain bodily functions -- such as muscle tension, heart rate and blood pressure -- to prevent headaches or reduce headache pain.    Proceed to emergency room if you experience new or worsening symptoms or symptoms do not resolve, if you have new neurologic symptoms or if headache is severe, or for any concerning symptom.    Orders Placed This Encounter  Procedures   MR BRAIN W WO CONTRAST   MR MRV HEAD WO CM   DG FLUORO GUIDED LOC OF NEEDLE/CATH TIP FOR SPINAL INJECT LT   Comprehensive metabolic panel   CBC   Ambulatory referral to [facility]   Meds ordered this encounter  Medications   acetaZOLAMIDE (DIAMOX) 500 MG capsule    Sig: Take 1 capsule (500 mg total) by mouth 2 (two) times daily.    Dispense:  60 capsule    Refill:  6    (previous Addendum: Received records from [facility]. Where she was seen in 02/2014. She presented with blurred and double vision since December 4th, headache. MRi of the brain per report was normal. She was sent home with a referred to Opthalmology. Reviewed Opthalmology records, who identified papilledema bilat; significant disk edema and swelling with multiple peripapillary heme bilat and patient was sent directly to [facility] for Lumbar Puncture which was > 50.)  Christina Dean, MD  [facility] [address] [place], [place] [address]  Phone [phone] Fax [phone]  A total of 30 minutes was spent face-to-face with this patient. Over half this time was spent on counseling patient on the IIH diagnosis and different diagnostic and therapeutic options available.

## 2018-05-27 NOTE — Patient Instructions (Addendum)
Start Diamox twice daily Lumbar puncture MRI of the brain  [facility] Follow up in 3-4 weeks   Idiopathic Intracranial Hypertension  Idiopathic intracranial hypertension (IIH) is a condition that increases pressure around the brain. The fluid that surrounds the brain and spinal cord (cerebrospinal fluid, CSF) increases and causes the pressure. Idiopathic means that the cause of this condition is not known. IIH affects the brain and spinal cord (is a neurological disorder). If this condition is not treated, it can cause vision loss or blindness. What increases the risk? You are more likely to develop this condition if:  You are severely overweight (obese).  You are a woman who has not gone through menopause.  You take certain medicines, such as birth control or steroids. What are the signs or symptoms? Symptoms of IIH include:  Headaches. This is the most common symptom.  Pain in the shoulders or neck.  Nausea and vomiting.  A "rushing water" or pulsing sound within the ears (pulsatile tinnitus).  Double vision.  Blurred vision.  Brief episodes of complete vision loss. How is this diagnosed? This condition may be diagnosed based on:  Your symptoms.  Your medical history.  CT scan of the brain.  MRI of the brain.  Magnetic resonance venogram (MRV) to check veins in the brain.  Diagnostic lumbar puncture. This is a procedure to remove and examine a sample of cerebrospinal fluid. This procedure can determine whether too much fluid may be causing IIH.  A thorough eye exam to check for swelling or nerve damage in the eyes. How is this treated? Treatment for this condition depends on your symptoms. The goal of treatment is to decrease the pressure around your brain. Common treatments include:  Medicines to decrease the production of spinal fluid and lower the pressure within your skull.  Medicines to prevent or treat headaches.  Surgery to  place drains (shunts) in your brain to remove excess fluid.  Lumbar puncture to remove excess cerebrospinal fluid. Follow these instructions at home:  If you are overweight or obese, work with your health care provider to lose weight.  Take over-the-counter and prescription medicines only as told by your health care provider.  Do not drive or use heavy machinery while taking medicines that can make you sleepy.  Keep all follow-up visits as told by your health care provider. This is important. Contact a health care provider if:  You have changes in your vision, such as: ? Double vision. ? Not being able to see colors (color vision). Get help right away if:  You have any of the following symptoms and they get worse or do not get better. ? Headaches. ? Nausea. ? Vomiting. ? Vision changes or difficulty seeing. Summary  Idiopathic intracranial hypertension (IIH) is a condition that increases pressure around the brain. The cause is not known (is idiopathic).  The most common symptom of IIH is headaches.  Treatment may include medicines or surgery to relieve the pressure on your brain. This information is not intended to replace advice given to you by your health care provider. Make sure you discuss any questions you have with your health care provider. Document Released: 05/05/2001 Document Revised: 01/16/2016 Document Reviewed: 01/16/2016 Elsevier Interactive Patient Education  2019 [employer].

## 2018-05-27 NOTE — Telephone Encounter (Signed)
[employer] pending faxed clinical notes  °

## 2018-05-28 LAB — CBC
HEMATOCRIT: 39 % (ref 34.0–46.6)
HEMOGLOBIN: 13.3 g/dL (ref 11.1–15.9)
MCH: 29.4 pg (ref 26.6–33.0)
MCHC: 34.1 g/dL (ref 31.5–35.7)
MCV: 86 fL (ref 79–97)
Platelets: 362 10*3/uL (ref 150–450)
RBC: 4.52 x10E6/uL (ref 3.77–5.28)
RDW: 13.3 % (ref 11.7–15.4)
WBC: 7.6 10*3/uL (ref 3.4–10.8)

## 2018-05-28 LAB — COMPREHENSIVE METABOLIC PANEL
A/G RATIO: 2.3 — AB (ref 1.2–2.2)
ALT: 13 IU/L (ref 0–32)
AST: 15 IU/L (ref 0–40)
Albumin: 4.6 g/dL (ref 3.8–4.8)
Alkaline Phosphatase: 52 IU/L (ref 39–117)
BUN/Creatinine Ratio: 13 (ref 9–23)
BUN: 9 mg/dL (ref 6–20)
Bilirubin Total: 0.4 mg/dL (ref 0.0–1.2)
CO2: 23 mmol/L (ref 20–29)
Calcium: 9.4 mg/dL (ref 8.7–10.2)
Chloride: 102 mmol/L (ref 96–106)
Creatinine, Ser: 0.72 mg/dL (ref 0.57–1.00)
GFR, EST AFRICAN AMERICAN: 128 mL/min/{1.73_m2} (ref >59)
GFR, EST NON AFRICAN AMERICAN: 111 mL/min/{1.73_m2} (ref >59)
Globulin, Total: 2 g/dL (ref 1.5–4.5)
Glucose: 92 mg/dL (ref 65–99)
Potassium: 4.2 mmol/L (ref 3.5–5.2)
Sodium: 141 mmol/L (ref 134–144)
TOTAL PROTEIN: 6.6 g/dL (ref 6.0–8.5)

## 2018-06-01 NOTE — Telephone Encounter (Signed)
Brain auth: [id number] (exp. 05/27/18 to 09/24/18) MRV Head Auth: [id number] (exp. 05/27/18 to 09/24/18) order sent to GI they will reach out to the pt to schedule.

## 2018-06-09 NOTE — Telephone Encounter (Signed)
Patient is scheduled at GI for 06/10/18

## 2018-06-10 ENCOUNTER — Ambulatory Visit
Admission: RE | Admit: 2018-06-10 | Discharge: 2018-06-10 | Disposition: A | Discharge status: Home / Self Care | Payer: BLUE CROSS/BLUE SHIELD | Source: Ambulatory Visit | Attending: Neurology | Admitting: Neurology

## 2018-06-10 ENCOUNTER — Other Ambulatory Visit: Payer: Self-pay

## 2018-06-10 DIAGNOSIS — G4489 Other headache syndrome: Secondary | ICD-10-CM | POA: Diagnosis not present

## 2018-06-10 DIAGNOSIS — R29818 Other symptoms and signs involving the nervous system: Secondary | ICD-10-CM

## 2018-06-10 DIAGNOSIS — R51 Headache with orthostatic component, not elsewhere classified: Secondary | ICD-10-CM

## 2018-06-10 DIAGNOSIS — H4710 Unspecified papilledema: Secondary | ICD-10-CM

## 2018-06-10 DIAGNOSIS — H532 Diplopia: Secondary | ICD-10-CM

## 2018-06-10 DIAGNOSIS — G932 Benign intracranial hypertension: Secondary | ICD-10-CM

## 2018-06-10 DIAGNOSIS — G08 Intracranial and intraspinal phlebitis and thrombophlebitis: Secondary | ICD-10-CM

## 2018-06-10 DIAGNOSIS — H53453 Other localized visual field defect, bilateral: Secondary | ICD-10-CM

## 2018-06-10 MED ORDER — GADOBENATE DIMEGLUMINE 529 MG/ML IV SOLN
15.0000 mL | Freq: Once | INTRAVENOUS | Status: AC | PRN
  Administered 2018-06-10: 15 mL via INTRAVENOUS

## 2018-06-14 ENCOUNTER — Telehealth: Payer: Self-pay | Admitting: Neurology

## 2018-06-14 NOTE — Telephone Encounter (Signed)
Called and spoke to patient. MRI c/w IIH.  She has no headaches, no vision changes, feels improved. Mild side effects like fatigue. She is on a very low dose, let's stay on the low dose and follow up in May when we can look at fundus, no vision changes. Start having symptoms again please call.   Cc: pcp and Ablott, Eric, OD   MRV: This MR venogram of the intracranial veins and sinuses does not show any sinus or venous occlusion.   There is mild stenosis in the mid transverse sinuses.  This is often seen in patients with idiopathic intracranial hypertension.    MRI brain: This MRI of the brain with and without contrast shows the following: 1.    The pituitary gland is mildly reduced in height and the optic nerve sheaths are increased in diameter.  These findings could be incidental or due to to elevated intracranial pressure as would be seen with idiopathic intracranial hypertension.    2.    There are some punctate T2/flair hyperintense foci in the subcortical white matter.  At least 1 of these was present on the 2015 MRI performed on a lower strength magnet.  These most likely represent sequela of migraine headaches for very minimal chronic microvascular ischemic changes. 3.    Mild maxillary and ethmoid chronic sinusitis, minimally improved compared to the 2015 MRI.

## 2018-06-23 ENCOUNTER — Ambulatory Visit: Payer: BLUE CROSS/BLUE SHIELD | Admitting: Neurology

## 2018-07-22 ENCOUNTER — Ambulatory Visit: Payer: BLUE CROSS/BLUE SHIELD | Admitting: Neurology

## 2018-08-05 ENCOUNTER — Ambulatory Visit
Admission: RE | Admit: 2018-08-05 | Discharge: 2018-08-05 | Disposition: A | Discharge status: Home / Self Care | Payer: BLUE CROSS/BLUE SHIELD | Source: Ambulatory Visit | Attending: Neurology | Admitting: Neurology

## 2018-08-05 ENCOUNTER — Other Ambulatory Visit: Payer: Self-pay

## 2018-08-05 VITALS — BP 132/90 | HR 69

## 2018-08-05 DIAGNOSIS — G932 Benign intracranial hypertension: Secondary | ICD-10-CM

## 2018-08-05 DIAGNOSIS — R51 Headache with orthostatic component, not elsewhere classified: Secondary | ICD-10-CM

## 2018-08-05 DIAGNOSIS — H4710 Unspecified papilledema: Secondary | ICD-10-CM

## 2018-08-05 LAB — CSF CELL COUNT WITH DIFFERENTIAL
RBC Count, CSF: 1 cells/uL (ref 0–10)
WBC, CSF: 0 cells/uL (ref 0–5)

## 2018-08-05 LAB — PROTEIN, CSF: Total Protein, CSF: 34 mg/dL (ref 15–45)

## 2018-08-05 LAB — GLUCOSE, CSF: Glucose, CSF: 60 mg/dL (ref 40–80)

## 2018-08-05 NOTE — Discharge Instructions (Signed)

## 2018-10-14 ENCOUNTER — Ambulatory Visit (INDEPENDENT_AMBULATORY_CARE_PROVIDER_SITE_OTHER): Payer: BC Managed Care – PPO | Admitting: Family Medicine

## 2018-10-14 ENCOUNTER — Encounter: Payer: Self-pay | Admitting: Family Medicine

## 2018-10-14 ENCOUNTER — Other Ambulatory Visit: Payer: Self-pay

## 2018-10-14 VITALS — BP 123/82 | HR 80 | Temp 97.8°F | Ht 63.0 in | Wt 167.0 lb

## 2018-10-14 DIAGNOSIS — G932 Benign intracranial hypertension: Secondary | ICD-10-CM | POA: Diagnosis not present

## 2018-10-14 NOTE — Progress Notes (Signed)
PATIENT: Christina Harmon DOB: [birth year]  REASON FOR VISIT: follow up HISTORY FROM: patient  Chief Complaint  Patient presents with  . Follow-up    Rm 1, fiance  . IIH    doing ok, better since LP 08-05-18 some throbbing headaches.  No vision issues.      HISTORY OF PRESENT ILLNESS: Today 10/14/18 Christina Harmon is a 33 y.o. female here today for follow up of IIH. LP in May with OP of 28. She is taking Diamox 500mg  twice daily. She reports that vision has improved. Last visit with ophthalmology was in June and was told that optic nerve looked much better. She has follow up in October. She does report fogginess and forgetfulness with Diamox. Symptoms are better if she takes medication consistently. Otherwise she is doing well.   HISTORY: (copied from Dr Cathren Laine note on 05/27/2018)  HPI: Patient is her at the request of Debbrah Alar. She has a history of IIH and has not been seen here for almost 4 years. She felt she was doing well these past years and stopped the Diamox. She has been worsening for a year. Daily headaches. Blurry vision. Also she will have double vision if she is very tired. Decreased peripheral vision bilaterally which is continuous. Continuous headache. Headache can be pulsating, worse with movement. She has light sensitivity. No nausea or vomiting.  No IUD or intrauterine birth control, no birth control. Started worsening with new job, she has to be at work at [employer] and husband reports she does not sleep well. Husband provides much information. Lights are bright at work which worsens it. She doesn't fluctuate with weight, she does not feel she has gained or lost.  She has a follow up appointment later this month with her eye doctor. Headaches are positionally worse. No other focal neurologic deficits, associated symptoms, inciting events or modifiable factors. Here with husband who also provides much information.  Reviewed notes from [facility].  Patient was  diagnosed with papilledema in 2015, she had onset of diplopia at that time in December, was seen at his urine care,.  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  Opening pressure was greater than 50.  She was started on Diamox and did well, in the past she had stopped it on her own.    Reviewed notes from [place] blocked OD, OD full range, pupils normal, APD negative, visual fields full to confrontation, she is 20/20 OD and 20/20 OS with correction, slit-lamp exam was normal, funduscopic exam normal however the optic nerve did show papilledema.  Return if significant papilledema, significant swelling noted, however patient is relatively asymptomatic.  Papilledema associated with increased intracranial pressure.  Significant swelling of optic nerve heads.  Patient was lost to follow-up.  REVIEW OF SYSTEMS: Out of a complete 14 system review of symptoms, the patient complains only of the following symptoms, none and all other reviewed systems are negative.  ALLERGIES: No Known Allergies  HOME MEDICATIONS: Outpatient Medications Prior to Visit  Medication Sig Dispense Refill  . acetaZOLAMIDE (DIAMOX) 500 MG capsule Take 1 capsule (500 mg total) by mouth 2 (two) times daily. 60 capsule 6  . Albuterol Sulfate (PROAIR RESPICLICK) 637 (90 BASE) MCG/ACT AEPB Inhale 2 puffs into the lungs every 4 (four) hours as needed. Reported on 04/02/2015    . loratadine (CLARITIN)  10 MG tablet Take 10 mg by mouth daily as needed for allergies.    . budesonide-formoterol (SYMBICORT) 160-4.5 MCG/ACT inhaler Inhale 2 puffs into the lungs 2 (two) times daily. (Patient not taking: Reported on 05/27/2018) 1 Inhaler 0   No facility-administered medications prior to visit.     PAST MEDICAL HISTORY: Past Medical History:  Diagnosis Date  . Asthma   . Headache   . Migraine   .  Papilledema     PAST SURGICAL HISTORY: Past Surgical History:  Procedure Laterality Date  . ANKLE SURGERY  2002   cyst removed from right ankle    FAMILY HISTORY: Family History  Problem Relation Age of Onset  . Depression Mother   . Hypothyroidism Mother   . COPD Mother   . Hypertension Father   . COPD Maternal Grandfather   . Stroke Maternal Grandfather        mini strokes  . Stroke Paternal Grandmother        mini strokes  . Cancer Other        leukemia--maternal great aunt  . Brain cancer Paternal Grandfather   . Pseudotumor cerebri Neg Hx     SOCIAL HISTORY: Social History   Socioeconomic History  . Marital status: Significant Other    Spouse name: Not on file  . Number of children: Not on file  . Years of education: Not on file  . Highest education level: High school graduate  Occupational History  . Not on file  Social Needs  . Financial resource strain: Not on file  . Food insecurity    Worry: Not on file    Inability: Not on file  . Transportation needs    Medical: Not on file    Non-medical: Not on file  Tobacco Use  . Smoking status: Never Smoker  . Smokeless tobacco: Never Used  Substance and Sexual Activity  . Alcohol use: Never    Frequency: Never  . Drug use: Never  . Sexual activity: Not on file  Lifestyle  . Physical activity    Days per week: Not on file    Minutes per session: Not on file  . Stress: Not on file  Relationships  . Social Musicianconnections    Talks on phone: Not on file    Gets together: Not on file    Attends religious service: Not on file    Active member of club or organization: Not on file    Attends meetings of clubs or organizations: Not on file    Relationship status: Not on file  . Intimate partner violence    Fear of current or ex partner: Not on file    Emotionally abused: Not on file    Physically abused: Not on file    Forced sexual activity: Not on file  Other Topics Concern  . Not on file  Social  History Narrative   Caffeine use:  1 cup of coffee daily & 1-2 soft drinks per day   Regular exercise:  No   Right handed   Lives at home with fiance Vonna KotykJay      PHYSICAL EXAM  Vitals:   10/14/18 1306  BP: 123/82  Pulse: 80  Temp: 97.8 F (36.6 C)  Weight: 167 lb (75.8 kg)  Height: 5\' 3"  (1.6 m)   Body mass index is 29.58 kg/m.  Generalized: Well developed, in no acute distress  Cardiology: normal rate and rhythm, no murmur noted Neurological examination  Mentation: Alert oriented to time,  place, history taking. Follows all commands speech and language fluent Cranial nerve II-XII: Pupils were equal round reactive to light. Extraocular movements were full, visual field were full on confrontational test. Facial sensation and strength were normal. Uvula tongue midline. Head turning and shoulder shrug  were normal and symmetric. Motor: The motor testing reveals 5 over 5 strength of all 4 extremities. Good symmetric motor tone is noted throughout.  Coordination: Cerebellar testing reveals good finger-nose-finger and heel-to-shin bilaterally.  Gait and station: Gait is normal.    DIAGNOSTIC DATA (LABS, IMAGING, TESTING) - I reviewed patient records, labs, notes, testing and imaging myself where available.  No flowsheet data found.   Lab Results  Component Value Date   WBC 7.6 05/27/2018   HGB 13.3 05/27/2018   HCT 39.0 05/27/2018   MCV 86 05/27/2018   PLT 362 05/27/2018      Component Value Date/Time   NA 141 05/27/2018 0847   K 4.2 05/27/2018 0847   CL 102 05/27/2018 0847   CO2 23 05/27/2018 0847   GLUCOSE 92 05/27/2018 0847   GLUCOSE 78 12/08/2012 1454   BUN 9 05/27/2018 0847   CREATININE 0.72 05/27/2018 0847   CREATININE 0.79 12/08/2012 1454   CALCIUM 9.4 05/27/2018 0847   PROT 6.6 05/27/2018 0847   ALBUMIN 4.6 05/27/2018 0847   AST 15 05/27/2018 0847   ALT 13 05/27/2018 0847   ALKPHOS 52 05/27/2018 0847   BILITOT 0.4 05/27/2018 0847   GFRNONAA 111 05/27/2018  0847   GFRNONAA >60 11/18/2010 1154   GFRAA 128 05/27/2018 0847   GFRAA >60 11/18/2010 1154   Lab Results  Component Value Date   CHOL 195 12/08/2012   HDL 65 12/08/2012   LDLCALC 114 (H) 12/08/2012   TRIG 80 12/08/2012   CHOLHDL 3.0 12/08/2012   No results found for: HGBA1C No results found for: VITAMINB12 Lab Results  Component Value Date   TSH 1.626 12/08/2012     ASSESSMENT AND PLAN 33 y.o. year old female  has a past medical history of Asthma, Headache, Migraine, and Papilledema. here with     ICD-10-CM   1. IIH (idiopathic intracranial hypertension)  G93.2     She is doing much better since LP in May. She admits that she was not taking Diamox consistently. For the past 2-3 weeks, she has taking medication every 12 hours as prescribed and feels side effects are less pronounced. She was encouraged to continue consistent dosing as prescribed. She will follow up with ophthalmology in October. Follow up recommended with me in 6 months. She and her fiance verbalize understanding of plan.    No orders of the defined types were placed in this encounter.    No orders of the defined types were placed in this encounter.     I spent 15 minutes with the patient. 50% of this time was spent counseling and educating patient on plan of care and medications.    Shawnie Dappermy Deitrick Ferreri, FNP-C 10/14/2018, 1:52 PM [facility] [address], [address] [place], [place] [id number] [phone]

## 2018-10-14 NOTE — Patient Instructions (Signed)
Continue Diamox 500mg  twice daily  Follow up in 6 month   Idiopathic Intracranial Hypertension  Idiopathic intracranial hypertension (IIH) is a condition that increases pressure around the brain. The fluid that surrounds the brain and spinal cord (cerebrospinal fluid, CSF) increases and causes the pressure. Idiopathic means that the cause of this condition is not known. IIH affects the brain and spinal cord (is a neurological disorder). If this condition is not treated, it can cause vision loss or blindness. What increases the risk? You are more likely to develop this condition if:  You are severely overweight (obese).  You are a woman who has not gone through menopause.  You take certain medicines, such as birth control or steroids. What are the signs or symptoms? Symptoms of IIH include:  Headaches. This is the most common symptom.  Pain in the shoulders or neck.  Nausea and vomiting.  A "rushing water" or pulsing sound within the ears (pulsatile tinnitus).  Double vision.  Blurred vision.  Brief episodes of complete vision loss. How is this diagnosed? This condition may be diagnosed based on:  Your symptoms.  Your medical history.  CT scan of the brain.  MRI of the brain.  Magnetic resonance venogram (MRV) to check veins in the brain.  Diagnostic lumbar puncture. This is a procedure to remove and examine a sample of cerebrospinal fluid. This procedure can determine whether too much fluid may be causing IIH.  A thorough eye exam to check for swelling or nerve damage in the eyes. How is this treated? Treatment for this condition depends on your symptoms. The goal of treatment is to decrease the pressure around your brain. Common treatments include:  Medicines to decrease the production of spinal fluid and lower the pressure within your skull.  Medicines to prevent or treat headaches.  Surgery to place drains (shunts) in your brain to remove excess fluid.   Lumbar puncture to remove excess cerebrospinal fluid. Follow these instructions at home:  If you are overweight or obese, work with your health care provider to lose weight.  Take over-the-counter and prescription medicines only as told by your health care provider.  Do not drive or use heavy machinery while taking medicines that can make you sleepy.  Keep all follow-up visits as told by your health care provider. This is important. Contact a health care provider if:  You have changes in your vision, such as: ? Double vision. ? Not being able to see colors (color vision). Get help right away if:  You have any of the following symptoms and they get worse or do not get better. ? Headaches. ? Nausea. ? Vomiting. ? Vision changes or difficulty seeing. Summary  Idiopathic intracranial hypertension (IIH) is a condition that increases pressure around the brain. The cause is not known (is idiopathic).  The most common symptom of IIH is headaches.  Treatment may include medicines or surgery to relieve the pressure on your brain. This information is not intended to replace advice given to you by your health care provider. Make sure you discuss any questions you have with your health care provider. Document Released: 05/05/2001 Document Revised: 02/06/2017 Document Reviewed: 01/16/2016 Elsevier Patient Education  2020 [employer].

## 2018-10-31 NOTE — Progress Notes (Signed)
Made any corrections needed, and agree with history, physical, neuro exam,assessment and plan as stated.     Antonia Ahern, MD [facility]  

## 2018-12-04 ENCOUNTER — Other Ambulatory Visit: Payer: Self-pay | Admitting: Neurology

## 2019-04-21 ENCOUNTER — Other Ambulatory Visit: Payer: Self-pay

## 2019-04-21 ENCOUNTER — Ambulatory Visit (INDEPENDENT_AMBULATORY_CARE_PROVIDER_SITE_OTHER): Payer: BC Managed Care – PPO | Admitting: Family Medicine

## 2019-04-21 ENCOUNTER — Encounter: Payer: Self-pay | Admitting: Family Medicine

## 2019-04-21 VITALS — BP 127/85 | HR 80 | Temp 97.8°F | Ht 63.0 in | Wt 168.0 lb

## 2019-04-21 DIAGNOSIS — G932 Benign intracranial hypertension: Secondary | ICD-10-CM

## 2019-04-21 DIAGNOSIS — G43109 Migraine with aura, not intractable, without status migrainosus: Secondary | ICD-10-CM | POA: Diagnosis not present

## 2019-04-21 DIAGNOSIS — G43709 Chronic migraine without aura, not intractable, without status migrainosus: Secondary | ICD-10-CM | POA: Insufficient documentation

## 2019-04-21 MED ORDER — SUMATRIPTAN SUCCINATE 100 MG PO TABS: 100.0000 mg | ORAL_TABLET | Freq: Once | ORAL | 2 refills | Status: DC | PRN

## 2019-04-21 NOTE — Patient Instructions (Addendum)
We will continue Diamox as prescribed   We will restart sumatriptan 100mg  as needed. Take 1 with migraine, may repeat 1 tablet in 2 hours but no more than 2 in 24 hours or 9 per month   Try to avoid Excedrin   Stay well hydrated, restart allergy medication   Follow up in 3 months, sooner if needed    Idiopathic Intracranial Hypertension  Idiopathic intracranial hypertension (IIH) is a condition that increases pressure around the brain. The fluid that surrounds the brain and spinal cord (cerebrospinal fluid, CSF) increases and causes the pressure. Idiopathic means that the cause of this condition is not known. IIH affects the brain and spinal cord (is a neurological disorder). If this condition is not treated, it can cause vision loss or blindness. What increases the risk? You are more likely to develop this condition if:  You are severely overweight (obese).  You are a woman who has not gone through menopause.  You take certain medicines, such as birth control or steroids. What are the signs or symptoms? Symptoms of IIH include:  Headaches. This is the most common symptom.  Pain in the shoulders or neck.  Nausea and vomiting.  A "rushing water" or pulsing sound within the ears (pulsatile tinnitus).  Double vision.  Blurred vision.  Brief episodes of complete vision loss. How is this diagnosed? This condition may be diagnosed based on:  Your symptoms.  Your medical history.  CT scan of the brain.  MRI of the brain.  Magnetic resonance venogram (MRV) to check veins in the brain.  Diagnostic lumbar puncture. This is a procedure to remove and examine a sample of cerebrospinal fluid. This procedure can determine whether too much fluid may be causing IIH.  A thorough eye exam to check for swelling or nerve damage in the eyes. How is this treated? Treatment for this condition depends on your symptoms. The goal of treatment is to decrease the pressure around your  brain. Common treatments include:  Medicines to decrease the production of spinal fluid and lower the pressure within your skull.  Medicines to prevent or treat headaches.  Surgery to place drains (shunts) in your brain to remove excess fluid.  Lumbar puncture to remove excess cerebrospinal fluid. Follow these instructions at home:  If you are overweight or obese, work with your health care provider to lose weight.  Take over-the-counter and prescription medicines only as told by your health care provider.  Do not drive or use heavy machinery while taking medicines that can make you sleepy.  Keep all follow-up visits as told by your health care provider. This is important. Contact a health care provider if:  You have changes in your vision, such as: ? Double vision. ? Not being able to see colors (color vision). Get help right away if:  You have any of the following symptoms and they get worse or do not get better. ? Headaches. ? Nausea. ? Vomiting. ? Vision changes or difficulty seeing. Summary  Idiopathic intracranial hypertension (IIH) is a condition that increases pressure around the brain. The cause is not known (is idiopathic).  The most common symptom of IIH is headaches.  Treatment may include medicines or surgery to relieve the pressure on your brain. This information is not intended to replace advice given to you by your health care provider. Make sure you discuss any questions you have with your health care provider. Document Revised: 02/06/2017 Document Reviewed: 01/16/2016 Elsevier Patient Education  2020 13/10/2015.  Acetazolamide Oral Tablets What is this medicine? ACETAZOLAMIDE (a set a ZOLE a mide) is a diuretic. It helps you make more urine and to lose salt and excess water from your body. It treats swelling from heart disease. It helps treat some seizures and some kinds of glaucoma. It also treats and prevents symptoms of altitude sickness (acute  mountain sickness). This medicine may be used for other purposes; ask your health care provider or pharmacist if you have questions. COMMON BRAND NAME(S): Diamox What should I tell my health care provider before I take this medicine? They need to know if you have any of these conditions:  glaucoma  kidney disease  liver disease  low adrenal gland function  lung or breathing disease (COPD, chronic bronchitis, emphysema)  an unusual or allergic reaction to acetazolamide, sulfa drugs, other drugs, foods, dyes or preservatives  pregnant or trying to get pregnant  breast-feeding How should I use this medicine? Take this medicine by mouth with a glass of water. Follow the directions on the prescription label. Take this medicine with food if it upsets your stomach. Take your doses at regular intervals. Do not take your medicine more often than directed. Do not stop taking except on your doctor's advice. Talk to your pediatrician regarding the use of this medicine in children. Special care may be needed. Patients over 81 years old may have a stronger reaction and need a smaller dose. Overdosage: If you think you have taken too much of this medicine contact a poison control center or emergency room at once. NOTE: This medicine is only for you. Do not share this medicine with others. What if I miss a dose? If you miss a dose, take it as soon as you can. If it is almost time for your next dose, take only that dose. Do not take double or extra doses. What may interact with this medicine? Do not take this medicine with any of the following medications:  methazolamide This medicine may also interact with the following medications:  aspirin and aspirin-like medicines  cyclosporine  lithium  medicine for diabetes  methenamine  other diuretics  phenytoin  primidone  quinidine  sodium bicarbonate  stimulant medicines like dextroamphetamine This list may not describe all possible  interactions. Give your health care provider a list of all the medicines, herbs, non-prescription drugs, or dietary supplements you use. Also tell them if you smoke, drink alcohol, or use illegal drugs. Some items may interact with your medicine. What should I watch for while using this medicine? Visit your doctor or health care professional for regular checks on your progress. You will need blood work done regularly. If you are diabetic, check your blood sugar as directed. You may need to be on a special diet while taking this medicine. Ask your doctor. Also, ask how many glasses of fluid you need to drink a day. You must not get dehydrated. You may get drowsy or dizzy. Do not drive, use machinery, or do anything that needs mental alertness until you know how this medicine affects you. Do not stand or sit up quickly, especially if you are an older patient. This reduces the risk of dizzy or fainting spells. This medicine can make you more sensitive to the sun. Keep out of the sun. If you cannot avoid being in the sun, wear protective clothing and use sunscreen. Do not use sun lamps or tanning beds/booths. What side effects may I notice from receiving this medicine? Side effects that you should  report to your doctor or health care professional as soon as possible:  allergic reactions like skin rash, itching or hives, swelling of the face, lips, or tongue  breathing problems  confusion, depression  dark urine  fever  numbness, tingling in hands or feet  redness, blistering, peeling or loosening of the skin, including inside the mouth  ringing in the ears  seizures  unusually weak or tired  yellowing of the eyes or skin Side effects that usually do not require medical attention (report to your doctor or health care professional if they continue or are bothersome):  change in taste  diarrhea  headache  loss of appetite  nausea, vomiting  passing urine more often This list may  not describe all possible side effects. Call your doctor for medical advice about side effects. You may report side effects to FDA at 1-800-FDA-1088. Where should I keep my medicine? Keep out of the reach of children. Store at room temperature between 20 and 25 degrees C (68 and 77 degrees F). Throw away any unused medicine after the expiration date. NOTE: This sheet is a summary. It may not cover all possible information. If you have questions about this medicine, talk to your doctor, pharmacist, or health care provider.  2020 Elsevier/Gold Standard (2018-12-21 12:32:38)

## 2019-04-21 NOTE — Progress Notes (Addendum)
PATIENT: Christina Harmon DOB: [birth year]  REASON FOR VISIT: follow up HISTORY FROM: patient  Chief Complaint  Patient presents with  . Follow-up    NewRM. With husband. Doing well no questions nor concerns. States she has been doing well.     HISTORY OF PRESENT ILLNESS: Today 04/21/19 Christina BARAY is a 34 y.o. 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  She continues Diamox 500 mg twice daily.  She is tolerating this medication well.  She does note improvement in headaches both in intensity and frequency.  She continues to have 2 general headaches weekly.  She has migrainous features 2-3 times per month.  She does admit to using Excedrin at least every other day.  She has recently ran out of her allergy medicine and is having more asthma flares.  She admits that she is not drinking a lot of water and continues to skip meals from time to time.  She has noted black spots in her vision prior to migraines.  This has occurred frequently in the past.  She reports that sumatriptan helps significantly in the past if she could take this medication in time.  She was seen by ophthalmology last month with a normal report.  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  She is not currently on birth control.  HISTORY: (copied from my note on 10/14/2018)  Christina Harmon is a 34 y.o. female here today for follow up of IIH. LP in May with OP of 28. She is taking Diamox 500mg  twice daily. She reports that vision has improved. Last visit with ophthalmology was in June and was told that optic nerve looked much better. She has follow up in October. She does report fogginess and forgetfulness with Diamox. Symptoms are better if she takes medication consistently. Otherwise she is doing well.   HISTORY: (copied from Dr November note on 05/27/2018)  HPI: Patient is her at the request of 05/29/2018. She has a history of IIH and has not been seen here for almost 4  years. She felt she was doing well these past years and stopped the Diamox. She has been worsening for a year. Daily headaches. Blurry vision. Also she will have double vision if she is very tired. Decreased peripheral vision bilaterally which is continuous. Continuous headache. Headache can be pulsating, worse with movement. She has light sensitivity. No nausea or vomiting. No IUD or intrauterine birth control, no birth control. Started worsening with new job, she has to be at work at [employer] and husband reports she does not sleep well. Husband provides much information. Lights are bright at work which worsens it. She doesn't fluctuate with weight, she does not feel she has gained or lost. She has a follow up appointment later this month with her eye doctor. Headaches are positionally worse.No other focal neurologic deficits, associated symptoms, inciting events or modifiable factors.Here with husband who also provides much information.  Reviewed notes from [facility]. Patient was diagnosed with papilledema in 2015, she had onset of diplopia at that time in December, was seen at his urine care,. 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 Opening pressure was greater than 50. She was started on Diamox and did well, in the past she had stopped it on her own.   Reviewed notes from  Eric blocked OD, OD full range, pupils normal, APD negative, visual fields full to confrontation, she is 20/20 OD and 20/20 OS with correction, slit-lamp exam was normal, funduscopic exam normal however the optic nerve did show papilledema. Return if significant papilledema, significant swelling noted, however patient is relatively asymptomatic. Papilledema associated with increased intracranial pressure. Significant swelling of optic nerve heads. Patient was lost to  follow-up.   REVIEW OF SYSTEMS: Out of a complete 14 system review of symptoms, the patient complains only of the following symptoms, headaches, seasonal allergies, asthma and all other reviewed systems are negative.  ALLERGIES: No Known Allergies  HOME MEDICATIONS: Outpatient Medications Prior to Visit  Medication Sig Dispense Refill  . acetaZOLAMIDE (DIAMOX) 500 MG capsule TAKE 1 CAPSULE BY MOUTH TWICE A DAY 180 capsule 2  . Albuterol Sulfate (PROAIR RESPICLICK) 108 (90 BASE) MCG/ACT AEPB Inhale 2 puffs into the lungs every 4 (four) hours as needed. Reported on 04/02/2015    . loratadine (CLARITIN) 10 MG tablet Take 10 mg by mouth daily as needed for allergies.     No facility-administered medications prior to visit.    PAST MEDICAL HISTORY: Past Medical History:  Diagnosis Date  . Asthma   . Headache   . Migraine   . Papilledema     PAST SURGICAL HISTORY: Past Surgical History:  Procedure Laterality Date  . ANKLE SURGERY  2002   cyst removed from right ankle    FAMILY HISTORY: Family History  Problem Relation Age of Onset  . Depression Mother   . Hypothyroidism Mother   . COPD Mother   . Hypertension Father   . COPD Maternal Grandfather   . Stroke Maternal Grandfather        mini strokes  . Stroke Paternal Grandmother        mini strokes  . Cancer Other        leukemia--maternal great aunt  . Brain cancer Paternal Grandfather   . Pseudotumor cerebri Neg Hx     SOCIAL HISTORY: Social History   Socioeconomic History  . Marital status: Significant Other    Spouse name: Not on file  . Number of children: Not on file  . Years of education: Not on file  . Highest education level: High school graduate  Occupational History  . Not on file  Tobacco Use  . Smoking status: Never Smoker  . Smokeless tobacco: Never Used  Substance and Sexual Activity  . Alcohol use: Never  . Drug use: Never  . Sexual activity: Not on file  Other Topics Concern  . Not on  file  Social History Narrative   Caffeine use:  1 cup of coffee daily & 1-2 soft drinks per day   Regular exercise:  No   Right handed   Lives at home with fiance Vonna Kotyk   Social Determinants of Health   Financial Resource Strain:   . Difficulty of Paying Living Expenses: Not on file  Food Insecurity:   . Worried About Programme researcher, broadcasting/film/video in the Last Year: Not on file  . Ran Out of Food in the Last Year: Not on file  Transportation Needs:   . Lack of Transportation (Medical): Not on file  . Lack of Transportation (Non-Medical): Not on file  Physical Activity:   . Days of Exercise per Week: Not on file  . Minutes of Exercise per Session: Not on file  Stress:   . Feeling of Stress : Not on file  Social Connections:   .  Frequency of Communication with Friends and Family: Not on file  . Frequency of Social Gatherings with Friends and Family: Not on file  . Attends Religious Services: Not on file  . Active Member of Clubs or Organizations: Not on file  . Attends Archivist Meetings: Not on file  . Marital Status: Not on file  Intimate Partner Violence:   . Fear of Current or Ex-Partner: Not on file  . Emotionally Abused: Not on file  . Physically Abused: Not on file  . Sexually Abused: Not on file      PHYSICAL EXAM  Vitals:   04/21/19 1259  BP: 127/85  Pulse: 80  Temp: 97.8 F (36.6 C)  Weight: 168 lb (76.2 kg)  Height: 5\' 3"  (1.6 m)   Body mass index is 29.76 kg/m.  Generalized: Well developed, in no acute distress  Cardiology: normal rate and rhythm, no murmur noted Respiratory: Clear to auscultation bilaterally Neurological examination  Mentation: Alert oriented to time, place, history taking. Follows all commands speech and language fluent Cranial nerve II-XII: Pupils were equal round reactive to light. Extraocular movements were full, visual field were full on confrontational test. Facial sensation and strength were normal. Uvula tongue midline. Head  turning and shoulder shrug  were normal and symmetric. Motor: The motor testing reveals 5 over 5 strength of all 4 extremities. Good symmetric motor tone is noted throughout.  Sensory: Sensory testing is intact to soft touch on all 4 extremities. No evidence of extinction is noted.  Coordination: Cerebellar testing reveals good finger-nose-finger and heel-to-shin bilaterally.  Gait and station: Gait is normal.   DIAGNOSTIC DATA (LABS, IMAGING, TESTING) - I reviewed patient records, labs, notes, testing and imaging myself where available.  No flowsheet data found.   Lab Results  Component Value Date   WBC 7.6 05/27/2018   HGB 13.3 05/27/2018   HCT 39.0 05/27/2018   MCV 86 05/27/2018   PLT 362 05/27/2018      Component Value Date/Time   NA 141 05/27/2018 0847   K 4.2 05/27/2018 0847   CL 102 05/27/2018 0847   CO2 23 05/27/2018 0847   GLUCOSE 92 05/27/2018 0847   GLUCOSE 78 12/08/2012 1454   BUN 9 05/27/2018 0847   CREATININE 0.72 05/27/2018 0847   CREATININE 0.79 12/08/2012 1454   CALCIUM 9.4 05/27/2018 0847   PROT 6.6 05/27/2018 0847   ALBUMIN 4.6 05/27/2018 0847   AST 15 05/27/2018 0847   ALT 13 05/27/2018 0847   ALKPHOS 52 05/27/2018 0847   BILITOT 0.4 05/27/2018 0847   GFRNONAA 111 05/27/2018 0847   GFRNONAA >60 11/18/2010 1154   GFRAA 128 05/27/2018 0847   GFRAA >60 11/18/2010 1154   Lab Results  Component Value Date   CHOL 195 12/08/2012   HDL 65 12/08/2012   LDLCALC 114 (H) 12/08/2012   TRIG 80 12/08/2012   CHOLHDL 3.0 12/08/2012   No results found for: HGBA1C No results found for: VITAMINB12 Lab Results  Component Value Date   TSH 1.626 12/08/2012       ASSESSMENT AND PLAN 34 y.o. year old female  has a past medical history of Asthma, Headache, Migraine, and Papilledema. here with     ICD-10-CM   1. IIH (idiopathic intracranial hypertension)  G93.2   2. Migraine with aura and without status migrainosus, not intractable  G43.109     Jamel  reports that overall she is doing much better.  She is tolerating Diamox well with no obvious adverse  effects.  She does continue to have 2-3 migrainous headaches monthly.  We have discussed many causes of migraines.  I have advised that she resume allergy treatment.  She will follow-up closely with her primary care provider for concerns of asthma flares.  I have advised that she stay well-hydrated and eat a well-balanced diet.  She was advised to avoid skipping meals.  Regular exercise can also help.  She has taken sumatriptan in the past.  We have discussed literature regarding triptan therapy and migraines with our as well as increased risk of stroke in women with migraines with aura.  She is very hesitant to add any additional preventative medications.  She has taken sumatriptan for years in the past with no difficulty.  We will restart sumatriptan as needed for abortive therapy.  She was advised on appropriate administration of this medication.  She will avoid Excedrin and not use either medication regularly.  She will continue close follow-up with ophthalmology.  She will follow-up with me in 3 months, sooner if needed.  She verbalized understanding and agreement with this plan.   No orders of the defined types were placed in this encounter.    Meds ordered this encounter  Medications  . SUMAtriptan (IMITREX) 100 MG tablet    Sig: Take 1 tablet (100 mg total) by mouth once as needed for up to 1 dose for migraine. May repeat in 2 hours if headache persists or recurs.    Dispense:  10 tablet    Refill:  2    Order Specific Question:   Supervising Provider    Answer:   Anson Fret [id number]      I spent 15 minutes with the patient. 50% of this time was spent counseling and educating patient on plan of care and medications.    Shawnie Dapper, FNP-C 04/21/2019, 2:50 PM [facility] [address] [place], [place] [address] [phone]  Made any corrections needed,  and agree with history, physical, neuro exam,assessment and plan as stated.     Naomie Dean, MD [facility]

## 2019-05-19 ENCOUNTER — Ambulatory Visit (INDEPENDENT_AMBULATORY_CARE_PROVIDER_SITE_OTHER): Payer: BC Managed Care – PPO | Admitting: Pulmonary Disease

## 2019-05-19 ENCOUNTER — Other Ambulatory Visit: Payer: Self-pay

## 2019-05-19 ENCOUNTER — Encounter: Payer: Self-pay | Admitting: Pulmonary Disease

## 2019-05-19 DIAGNOSIS — K219 Gastro-esophageal reflux disease without esophagitis: Secondary | ICD-10-CM

## 2019-05-19 DIAGNOSIS — J4541 Moderate persistent asthma with (acute) exacerbation: Secondary | ICD-10-CM | POA: Diagnosis not present

## 2019-05-19 MED ORDER — MONTELUKAST SODIUM 10 MG PO TABS: 10.0000 mg | ORAL_TABLET | Freq: Every day | ORAL | 5 refills | Status: DC

## 2019-05-19 NOTE — Progress Notes (Signed)
   Subjective:    Patient ID: Christina Harmon, female    DOB: [birth year], 34 y.o.   MRN: [id number]  HPI  34 yo never smoker for follow-up of asthma, onset around 2015 She does have sinusitis and GERD Hx of retrognathia and pseudotumor cerebri   Last seen 05/2017 -she was given a sample of Symbicort and given alternatives for steroid inhalers However all of these were expensive and she did not end up taking any consistently when she was done with samples. She reports flareups during the winter and fall with weather changes.  Breathing has been worse for the past year and she finally had an attack of bronchitis 2 weeks ago requiring Z-Pak and was given budesonide nebs -this seemed to make her better and she still has several nebs leftover.  She reports occasional postnasal drip for which she has used Flonase in the past and she feels that Singulair seem to help but allergy symptoms during weather changes She denies significant reflux symptoms  She works at [employer], has not received Covid vaccine yet   Significant tests/ events reviewed  FENO 05/2016 127 05/15/16 CBC w/ Diff >+ eos (1800) , IgE 35  PFT 06/26/16 Normal , FEV1 92% , ratio 77 , FVC 100%, DLCO nml  CT sinus 2017 >mucosal thickening , small amt of fluid , chronic sinusitis  Review of Systems Patient denies significant dyspnea,cough, hemoptysis,  chest pain, palpitations, pedal edema, orthopnea, paroxysmal nocturnal dyspnea, lightheadedness, nausea, vomiting, abdominal or  leg pains      Objective:   Physical Exam  Gen. Pleasant, well-nourished, in no distress ENT - no thrush, no pallor/icterus,no post nasal drip Neck: No JVD, no thyromegaly, no carotid bruits Lungs: no use of accessory muscles, no dullness to percussion, clear without rales or rhonchi  Cardiovascular: Rhythm regular, heart sounds  normal, no murmurs or gallops, no peripheral edema Musculoskeletal: No deformities, no cyanosis or clubbing          Assessment & Plan:

## 2019-05-19 NOTE — Patient Instructions (Signed)
Start Singulair 10 mg at bedtime during spring and fall x 5 refills Sample of Breo 100 -take once daily if you have a flare  Finish taking budesonide twice daily Then you can start on one of the following medications, call us back to let us know which one is covered by insurance and we can send in prescription -Symbicort, Advair, Breo, Dulera -OR Pulmicort, Flovent, Qvar

## 2019-05-19 NOTE — Assessment & Plan Note (Signed)
Quiescent currently

## 2019-05-19 NOTE — Assessment & Plan Note (Signed)
She does seem to have persistent symptoms and needs a steroid inhaler unfortunately it has been difficult finding one that is cheap enough and covered by her insurance.  Flares are mostly triggered by weather changes  Start Singulair 10 mg at bedtime during spring and fall x 5 refills Sample of Breo 100 -take once daily if you have a flare  Finish taking budesonide twice daily Then you can start on one of the following medications, gave her several alternatives for steroid inhalers or steroid/LABA combination call us back to let us know which one is covered by insurance and we can send in prescription

## 2019-07-19 ENCOUNTER — Ambulatory Visit: Payer: BC Managed Care – PPO | Admitting: Family Medicine

## 2019-07-21 ENCOUNTER — Encounter: Payer: Self-pay | Admitting: Family Medicine

## 2019-07-21 ENCOUNTER — Ambulatory Visit (INDEPENDENT_AMBULATORY_CARE_PROVIDER_SITE_OTHER): Payer: BC Managed Care – PPO | Admitting: Family Medicine

## 2019-07-21 ENCOUNTER — Other Ambulatory Visit: Payer: Self-pay

## 2019-07-21 VITALS — BP 126/91 | HR 80 | Temp 97.0°F | Ht 63.0 in | Wt 165.0 lb

## 2019-07-21 DIAGNOSIS — G932 Benign intracranial hypertension: Secondary | ICD-10-CM

## 2019-07-21 DIAGNOSIS — G43109 Migraine with aura, not intractable, without status migrainosus: Secondary | ICD-10-CM

## 2019-07-21 MED ORDER — ACETAZOLAMIDE 250 MG PO TABS: 250.0000 mg | ORAL_TABLET | Freq: Two times a day (BID) | ORAL | 3 refills | Status: DC

## 2019-07-21 NOTE — Progress Notes (Addendum)
PATIENT: Christina Harmon DOB: 12-05-1985  REASON FOR VISIT: follow up HISTORY FROM: patient  Chief Complaint  Patient presents with  . Follow-up    IIH, RM 2, with husband, pt states she feels fine but DIAMOX makes her feel drowsy     HISTORY OF PRESENT ILLNESS: Today 07/21/19 Christina Harmon is a 34 y.o. female here today for follow up for IIH. She continues Diamox 500mg  twice daily. She continues to have concerns of brain fog and sleepiness. She has noted worsening of tingling in her hands, feet and sometimes lips. She has taken sumatriptan and feels that it works well to abort headache. Headaches are much less frequent and less severe. She has taken 2 sumatriptan in the past 3 months. She denies any vision changes. She has taken allergy medications consistently. She is eating regular meals and drinking more water.   HISTORY: (copied from my note on 04/21/2019)  Christina Harmon is a 34 y.o. 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  She continues Diamox 500 mg twice daily.  She is tolerating this medication well.  She does note improvement in headaches both in intensity and frequency.  She continues to have 2 general headaches weekly.  She has migrainous features 2-3 times per month.  She does admit to using Excedrin at least every other day.  She has recently ran out of her allergy medicine and is having more asthma flares.  She admits that she is not drinking a lot of water and continues to skip meals from time to time.  She has noted black spots in her vision prior to migraines.  This has occurred frequently in the past.  She reports that sumatriptan helps significantly in the past if she could take this medication in time.  She was seen by ophthalmology last month with a normal report.  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  She is not currently on birth control.  HISTORY: (copied from my note on 10/14/2018)  Christina Squibb Busheeis a 34  y.o.femalehere today for follow up of IIH. LP in May with OP of 28. She is taking Diamox 500mg  twice daily. She reports that vision has improved. Last visit with ophthalmology was in June and was told that optic nerve looked much better. She has follow up in October. She does report fogginess and forgetfulness with Diamox. Symptoms are better if she takes medication consistently. Otherwise she is doing well.  HISTORY: (copied fromDr Ahern'snote on3/19/2020)  HPI: Patient is her at the request of Christina Harmon. She has a history of IIH and has not been seen here for almost 4 years. She felt she was doing well these past years and stopped the Diamox. She has been worsening for a year. Daily headaches. Blurry vision. Also she will have double vision if she is very tired. Decreased peripheral vision bilaterally which is continuous. Continuous headache. Headache can be pulsating, worse with movement. She has light sensitivity. No nausea or vomiting. No IUD or intrauterine birth control, no birth control. Started worsening with new job, she has to be at work at 05/29/2018 and husband reports she does not sleep well. Husband provides much information. Lights are bright at work which worsens it. She doesn't fluctuate with weight, she does not feel she has gained or lost. She has a follow up appointment later this month with her eye doctor. Headaches are positionally worse.No other focal neurologic deficits, associated symptoms, inciting events  or modifiable factors.Here with husband who also provides much information.  Reviewed notes from [facility]. Patient was diagnosed with papilledema in 2015, she had onset of diplopia at that time in December, was seen at his urine care,. 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 She was diagnosed with likely IIA H, patient was sent directly to [facility] emergency room and a lumbar puncture was performed. Opening pressure was greater than 50.  She was started on Diamox and did well, in the past she had stopped it on her own.   Reviewed notes from [place] blocked OD, OD full range, pupils normal, APD negative, visual fields full to confrontation, she is 20/20 OD and 20/20 OS with correction, slit-lamp exam was normal, funduscopic exam normal however the optic nerve did show papilledema. Return if significant papilledema, significant swelling noted, however patient is relatively asymptomatic. Papilledema associated with increased intracranial pressure. Significant swelling of optic nerve heads. Patient was lost to follow-up.   REVIEW OF SYSTEMS: Out of a complete 14 system review of symptoms, the patient complains only of the following symptoms, headaches, seasonal allergies, anxiety and all other reviewed systems are negative.  ALLERGIES: No Known Allergies  HOME MEDICATIONS: Outpatient Medications Prior to Visit  Medication Sig Dispense Refill  . acetaZOLAMIDE (DIAMOX) 500 MG capsule TAKE 1 CAPSULE BY MOUTH TWICE A DAY 180 capsule 2  . Albuterol Sulfate (PROAIR RESPICLICK) 341 (90 BASE) MCG/ACT AEPB Inhale 2 puffs into the lungs every 4 (four) hours as needed. Reported on 04/02/2015    . budesonide (PULMICORT) 0.5 MG/2ML nebulizer solution Take by nebulization daily.    Marland Kitchen loratadine (CLARITIN) 10 MG tablet Take 10 mg by mouth daily as needed for allergies.    . montelukast (SINGULAIR) 10 MG tablet Take 1 tablet (10 mg total) by mouth at bedtime. 30 tablet 5  . SUMAtriptan (IMITREX) 100 MG tablet Take 1 tablet (100 mg total) by mouth once as needed for up to 1 dose for migraine. May repeat in 2 hours if headache persists or recurs. 10 tablet 2   No facility-administered medications prior to visit.    PAST MEDICAL HISTORY: Past Medical History:  Diagnosis Date  . Asthma   . Headache   . Migraine   . Papilledema     PAST SURGICAL HISTORY: Past Surgical History:  Procedure Laterality Date  . ANKLE SURGERY  2002   cyst  removed from right ankle    FAMILY HISTORY: Family History  Problem Relation Age of Onset  . Depression Mother   . Hypothyroidism Mother   . COPD Mother   . Hypertension Father   . COPD Maternal Grandfather   . Stroke Maternal Grandfather        mini strokes  . Stroke Paternal Grandmother        mini strokes  . Cancer Other        leukemia--maternal great aunt  . Brain cancer Paternal Grandfather   . Pseudotumor cerebri Neg Hx     SOCIAL HISTORY: Social History   Socioeconomic History  . Marital status: Significant Other    Spouse name: Not on file  . Number of children: Not on file  . Years of education: Not on file  . Highest education level: High school graduate  Occupational History  . Not on file  Tobacco Use  . Smoking status: Never Smoker  . Smokeless tobacco: Never Used  Substance and Sexual Activity  . Alcohol use: Never  . Drug use: Never  .  Sexual activity: Not on file  Other Topics Concern  . Not on file  Social History Narrative   Caffeine use:  1 cup of coffee daily & 1-2 soft drinks per day   Regular exercise:  No   Right handed   Lives at home with fiance Vonna Kotyk   Social Determinants of Health   Financial Resource Strain:   . Difficulty of Paying Living Expenses:   Food Insecurity:   . Worried About Programme researcher, broadcasting/film/video in the Last Year:   . Barista in the Last Year:   Transportation Needs:   . Freight forwarder (Medical):   Marland Kitchen Lack of Transportation (Non-Medical):   Physical Activity:   . Days of Exercise per Week:   . Minutes of Exercise per Session:   Stress:   . Feeling of Stress :   Social Connections:   . Frequency of Communication with Friends and Family:   . Frequency of Social Gatherings with Friends and Family:   . Attends Religious Services:   . Active Member of Clubs or Organizations:   . Attends Banker Meetings:   Marland Kitchen Marital Status:   Intimate Partner Violence:   . Fear of Current or Ex-Partner:     . Emotionally Abused:   Marland Kitchen Physically Abused:   . Sexually Abused:       PHYSICAL EXAM  Vitals:   07/21/19 1233  BP: (!) 126/91  Pulse: 80  Temp: (!) 97 F (36.1 C)  Weight: 165 lb (74.8 kg)  Height: 5\' 3"  (1.6 m)   Body mass index is 29.23 kg/m.  Generalized: Well developed, in no acute distress  Cardiology: normal rate and rhythm, no murmur noted Respiratory: clear to auscultation bilaterally  Neurological examination  Mentation: Alert oriented to time, place, history taking. Follows all commands speech and language fluent Cranial nerve II-XII: Pupils were equal round reactive to light. Extraocular movements were full, visual field were full on confrontational test. Facial sensation and strength were normal. Uvula tongue midline. Head turning and shoulder shrug  were normal and symmetric. Motor: The motor testing reveals 5 over 5 strength of all 4 extremities. Good symmetric motor tone is noted throughout.  Sensory: Sensory testing is intact to soft touch on all 4 extremities. No evidence of extinction is noted.  Coordination: Cerebellar testing reveals good finger-nose-finger and heel-to-shin bilaterally.  Gait and station: Gait is normal.    DIAGNOSTIC DATA (LABS, IMAGING, TESTING) - I reviewed patient records, labs, notes, testing and imaging myself where available.  No flowsheet data found.   Lab Results  Component Value Date   WBC 7.6 05/27/2018   HGB 13.3 05/27/2018   HCT 39.0 05/27/2018   MCV 86 05/27/2018   PLT 362 05/27/2018      Component Value Date/Time   NA 141 05/27/2018 0847   K 4.2 05/27/2018 0847   CL 102 05/27/2018 0847   CO2 23 05/27/2018 0847   GLUCOSE 92 05/27/2018 0847   GLUCOSE 78 12/08/2012 1454   BUN 9 05/27/2018 0847   CREATININE 0.72 05/27/2018 0847   CREATININE 0.79 12/08/2012 1454   CALCIUM 9.4 05/27/2018 0847   PROT 6.6 05/27/2018 0847   ALBUMIN 4.6 05/27/2018 0847   AST 15 05/27/2018 0847   ALT 13 05/27/2018 0847   ALKPHOS  52 05/27/2018 0847   BILITOT 0.4 05/27/2018 0847   GFRNONAA 111 05/27/2018 0847   GFRNONAA >60 11/18/2010 1154   GFRAA 128 05/27/2018 0847   GFRAA >  60 11/18/2010 1154   Lab Results  Component Value Date   CHOL 195 12/08/2012   HDL 65 12/08/2012   LDLCALC 114 (H) 12/08/2012   TRIG 80 12/08/2012   CHOLHDL 3.0 12/08/2012   No results found for: HGBA1C No results found for: VITAMINB12 Lab Results  Component Value Date   TSH 1.626 12/08/2012       ASSESSMENT AND PLAN 34 y.o. year old female  has a past medical history of Asthma, Headache, Migraine, and Papilledema. here with     ICD-10-CM   1. IIH (idiopathic intracranial hypertension)  G93.2   2. Migraine with aura and without status migrainosus, not intractable  G43.109     [place] is doing well. She continues Diamox 500mg  twice daily. She is concerned of continued side effects of brain fog, sleepiness and dysesthesias. She reports being on 250mg  twice daily without these side effects and wishes to resume lower dose. Headaches are very well managed. She has taking sumatriptan twice in 3 months. She does have some tension headaches on occasion that are relieved with Tylenol. I will reduce dose to 250mg  twice daily. She will continue Tylenol and sumatriptan as needed for abortive therapy. She will continue to focus on healthy lifestyle habits and follow up closely with CP. She will monitor BP at home. She will follow up with me in 3 months, following her next eye exam. We will adjust treatment plan as needed pending next follow up. I anticipate annual follow up, thereafter.    No orders of the defined types were placed in this encounter.    No orders of the defined types were placed in this encounter.     I spent 15 minutes with the patient. 50% of this time was spent counseling and educating patient on plan of care and medications.    , FNP-C 07/21/2019, 12:53 PM [facility] [address] [place], 07/23/2019 [address] [phone]  Made any corrections needed, and agree with history, physical, neuro exam,assessment and plan as stated.     [place], MD [facility]

## 2019-07-21 NOTE — Patient Instructions (Signed)
  Let's try decreasing Diamox to 250mg  twice daily. Please monitor closely for any vision changes or worsening headaches.   Use sumatriptan sparingly for abortive therapy  Continue close follow up with PCP as directed. Stay well hydrated. Focus on well balanced diet and regular exercise.   Follow up in 3 months   Idiopathic Intracranial Hypertension  Idiopathic intracranial hypertension (IIH) is a condition that increases pressure around the brain. The fluid that surrounds the brain and spinal cord (cerebrospinal fluid, CSF) increases and causes the pressure. Idiopathic means that the cause of this condition is not known. IIH affects the brain and spinal cord (is a neurological disorder). If this condition is not treated, it can cause vision loss or blindness. What increases the risk? You are more likely to develop this condition if:  You are severely overweight (obese).  You are a woman who has not gone through menopause.  You take certain medicines, such as birth control or steroids. What are the signs or symptoms? Symptoms of IIH include:  Headaches. This is the most common symptom.  Pain in the shoulders or neck.  Nausea and vomiting.  A "rushing water" or pulsing sound within the ears (pulsatile tinnitus).  Double vision.  Blurred vision.  Brief episodes of complete vision loss. How is this diagnosed? This condition may be diagnosed based on:  Your symptoms.  Your medical history.  CT scan of the brain.  MRI of the brain.  Magnetic resonance venogram (MRV) to check veins in the brain.  Diagnostic lumbar puncture. This is a procedure to remove and examine a sample of cerebrospinal fluid. This procedure can determine whether too much fluid may be causing IIH.  A thorough eye exam to check for swelling or nerve damage in the eyes. How is this treated? Treatment for this condition depends on your symptoms. The goal of treatment is to decrease the pressure around  your brain. Common treatments include:  Medicines to decrease the production of spinal fluid and lower the pressure within your skull.  Medicines to prevent or treat headaches.  Surgery to place drains (shunts) in your brain to remove excess fluid.  Lumbar puncture to remove excess cerebrospinal fluid. Follow these instructions at home:  If you are overweight or obese, work with your health care provider to lose weight.  Take over-the-counter and prescription medicines only as told by your health care provider.  Do not drive or use heavy machinery while taking medicines that can make you sleepy.  Keep all follow-up visits as told by your health care provider. This is important. Contact a health care provider if:  You have changes in your vision, such as: ? Double vision. ? Not being able to see colors (color vision). Get help right away if:  You have any of the following symptoms and they get worse or do not get better. ? Headaches. ? Nausea. ? Vomiting. ? Vision changes or difficulty seeing. Summary  Idiopathic intracranial hypertension (IIH) is a condition that increases pressure around the brain. The cause is not known (is idiopathic).  The most common symptom of IIH is headaches.  Treatment may include medicines or surgery to relieve the pressure on your brain. This information is not intended to replace advice given to you by your health care provider. Make sure you discuss any questions you have with your health care provider. Document Revised: 02/06/2017 Document Reviewed: 01/16/2016 Elsevier Patient Education  2020 13/10/2015.

## 2019-10-26 NOTE — Progress Notes (Deleted)
PATIENT: Christina Harmon DOB: [birth year]  REASON FOR VISIT: follow up HISTORY FROM: patient  No chief complaint on file.    HISTORY OF PRESENT ILLNESS: Today 10/26/19 Christina Harmon is a 34 y.o. female here today for follow up for IIH. Diamox dose was decreased to 250mg  twice daily in 07/2019 due to concerns of brain fog and sleepiness.    HISTORY: (copied from my note on 07/21/2019)  Christina Harmon is a 34 y.o. female here today for follow up for IIH. She continues Diamox 500mg  twice daily. She continues to have concerns of brain fog and sleepiness. She has noted worsening of tingling in her hands, feet and sometimes lips. She has taken sumatriptan and feels that it works well to abort headache. Headaches are much less frequent and less severe. She has taken 2 sumatriptan in the past 3 months. She denies any vision changes. She has taken allergy medications consistently. She is eating regular meals and drinking more water.   HISTORY: (copied from my note on 04/21/2019)  Christina Harmon a 34 y.o.femalehere today for follow up if IIH.She continues Diamox 500 mg twice daily. She is tolerating this medication well. She does note improvement in headaches both in intensity and frequency. She continues to have 2 general headaches weekly. She has migrainous features 2-3 times per month. She does admit to using Excedrin at least every other day. She has recently ran out of her allergy medicine and is having more asthma flares. She admits that she is not drinking a lot of water and continues to skip meals from time to time. She has noted black spots in her vision prior to migraines. This has occurred frequently in the past. She reports that sumatriptan helps significantly in the past if she could take this medication in time. She was seen by ophthalmology last month with a normal report. 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 She  is not currently on birth control.  HISTORY: (copied frommynote on 10/14/2018)  Christina Harmon a 34 y.o.femalehere today for follow up of IIH. LP in May with OP of 28. She is taking Diamox 500mg  twice daily. She reports that vision has improved. Last visit with ophthalmology was in June and was told that optic nerve looked much better. She has follow up in October. She does report fogginess and forgetfulness with Diamox. Symptoms are better if she takes medication consistently. Otherwise she is doing well.  HISTORY: (copied fromDr Ahern'snote on3/19/2020)  HPI: Patient is her at the request of July. She has a history of IIH and has not been seen here for almost 4 years. She felt she was doing well these past years and stopped the Diamox. She has been worsening for a year. Daily headaches. Blurry vision. Also she will have double vision if she is very tired. Decreased peripheral vision bilaterally which is continuous. Continuous headache. Headache can be pulsating, worse with movement. She has light sensitivity. No nausea or vomiting. No IUD or intrauterine birth control, no birth control. Started worsening with new job, she has to be at work at November and husband reports she does not sleep well. Husband provides much information. Lights are bright at work which worsens it. She doesn't fluctuate with weight, she does not feel she has gained or lost. She has a follow up appointment later this month with her eye doctor. Headaches are positionally worse.No other focal neurologic deficits, associated symptoms, inciting events  or modifiable factors.Here with husband who also provides much information.  Reviewed notes from [facility]. Patient was diagnosed with papilledema in 2015, she had onset of diplopia at that time in December, was seen at his urine care,. 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 She was diagnosed with likely IIA H, patient was sent  directly to [facility] emergency room and a lumbar puncture was performed. Opening pressure was greater than 50. She was started on Diamox and did well, in the past she had stopped it on her own.   Reviewed notes from Eric blocked OD, OD full range, pupils normal, APD negative, visual fields full to confrontation, she is 20/20 OD and 20/20 OS with correction, slit-lamp exam was normal, funduscopic exam normal however the optic nerve did show papilledema. Return if significant papilledema, significant swelling noted, however patient is relatively asymptomatic. Papilledema associated with increased intracranial pressure. Significant swelling of optic nerve heads. Patient was lost to follow-up.   REVIEW OF SYSTEMS: Out of a complete 14 system review of symptoms, the patient complains only of the following symptoms, and all other reviewed systems are negative.  ALLERGIES: No Known Allergies  HOME MEDICATIONS: Outpatient Medications Prior to Visit  Medication Sig Dispense Refill  . acetaZOLAMIDE (DIAMOX) 250 MG tablet Take 1 tablet (250 mg total) by mouth 2 (two) times daily. 180 tablet 3  . Albuterol Sulfate (PROAIR RESPICLICK) 108 (90 BASE) MCG/ACT AEPB Inhale 2 puffs into the lungs every 4 (four) hours as needed. Reported on 04/02/2015    . budesonide (PULMICORT) 0.5 MG/2ML nebulizer solution Take by nebulization daily.    Marland Kitchen loratadine (CLARITIN) 10 MG tablet Take 10 mg by mouth daily as needed for allergies.    . montelukast (SINGULAIR) 10 MG tablet Take 1 tablet (10 mg total) by mouth at bedtime. 30 tablet 5  . SUMAtriptan (IMITREX) 100 MG tablet Take 1 tablet (100 mg total) by mouth once as needed for up to 1 dose for migraine. May repeat in 2 hours if headache persists or recurs. 10 tablet 2   No facility-administered medications prior to visit.    PAST MEDICAL HISTORY: Past Medical History:  Diagnosis Date  . Asthma   . Headache   . Migraine   . Papilledema     PAST  SURGICAL HISTORY: Past Surgical History:  Procedure Laterality Date  . ANKLE SURGERY  2002   cyst removed from right ankle    FAMILY HISTORY: Family History  Problem Relation Age of Onset  . Depression Mother   . Hypothyroidism Mother   . COPD Mother   . Hypertension Father   . COPD Maternal Grandfather   . Stroke Maternal Grandfather        mini strokes  . Stroke Paternal Grandmother        mini strokes  . Cancer Other        leukemia--maternal great aunt  . Brain cancer Paternal Grandfather   . Pseudotumor cerebri Neg Hx     SOCIAL HISTORY: Social History   Socioeconomic History  . Marital status: Significant Other    Spouse name: Not on file  . Number of children: Not on file  . Years of education: Not on file  . Highest education level: High school graduate  Occupational History  . Not on file  Tobacco Use  . Smoking status: Never Smoker  . Smokeless tobacco: Never Used  Vaping Use  . Vaping Use: Never used  Substance and Sexual Activity  .  Alcohol use: Never  . Drug use: Never  . Sexual activity: Not on file  Other Topics Concern  . Not on file  Social History Narrative   Caffeine use:  1 cup of coffee daily & 1-2 soft drinks per day   Regular exercise:  No   Right handed   Lives at home with fiance Vonna Kotyk   Social Determinants of Health   Financial Resource Strain:   . Difficulty of Paying Living Expenses:   Food Insecurity:   . Worried About Programme researcher, broadcasting/film/video in the Last Year:   . Barista in the Last Year:   Transportation Needs:   . Freight forwarder (Medical):   Marland Kitchen Lack of Transportation (Non-Medical):   Physical Activity:   . Days of Exercise per Week:   . Minutes of Exercise per Session:   Stress:   . Feeling of Stress :   Social Connections:   . Frequency of Communication with Friends and Family:   . Frequency of Social Gatherings with Friends and Family:   . Attends Religious Services:   . Active Member of Clubs or  Organizations:   . Attends Banker Meetings:   Marland Kitchen Marital Status:   Intimate Partner Violence:   . Fear of Current or Ex-Partner:   . Emotionally Abused:   Marland Kitchen Physically Abused:   . Sexually Abused:       PHYSICAL EXAM  There were no vitals filed for this visit. There is no height or weight on file to calculate BMI.  Generalized: Well developed, in no acute distress  Cardiology: normal rate and rhythm, no murmur noted Respiratory: clear to auscultation bilaterally  Neurological examination  Mentation: Alert oriented to time, place, history taking. Follows all commands speech and language fluent Cranial nerve II-XII: Pupils were equal round reactive to light. Extraocular movements were full, visual field were full on confrontational test. Facial sensation and strength were normal. Uvula tongue midline. Head turning and shoulder shrug  were normal and symmetric. Motor: The motor testing reveals 5 over 5 strength of all 4 extremities. Good symmetric motor tone is noted throughout.  Sensory: Sensory testing is intact to soft touch on all 4 extremities. No evidence of extinction is noted.  Coordination: Cerebellar testing reveals good finger-nose-finger and heel-to-shin bilaterally.  Gait and station: Gait is normal. Tandem gait is normal. Romberg is negative. No drift is seen.  Reflexes: Deep tendon reflexes are symmetric and normal bilaterally.   DIAGNOSTIC DATA (LABS, IMAGING, TESTING) - I reviewed patient records, labs, notes, testing and imaging myself where available.  No flowsheet data found.   Lab Results  Component Value Date   WBC 7.6 05/27/2018   HGB 13.3 05/27/2018   HCT 39.0 05/27/2018   MCV 86 05/27/2018   PLT 362 05/27/2018      Component Value Date/Time   NA 141 05/27/2018 0847   K 4.2 05/27/2018 0847   CL 102 05/27/2018 0847   CO2 23 05/27/2018 0847   GLUCOSE 92 05/27/2018 0847   GLUCOSE 78 12/08/2012 1454   BUN 9 05/27/2018 0847   CREATININE  0.72 05/27/2018 0847   CREATININE 0.79 12/08/2012 1454   CALCIUM 9.4 05/27/2018 0847   PROT 6.6 05/27/2018 0847   ALBUMIN 4.6 05/27/2018 0847   AST 15 05/27/2018 0847   ALT 13 05/27/2018 0847   ALKPHOS 52 05/27/2018 0847   BILITOT 0.4 05/27/2018 0847   GFRNONAA 111 05/27/2018 0847   GFRNONAA >60 11/18/2010 1154  GFRAA 128 05/27/2018 0847   GFRAA >60 11/18/2010 1154   Lab Results  Component Value Date   CHOL 195 12/08/2012   HDL 65 12/08/2012   LDLCALC 114 (H) 12/08/2012   TRIG 80 12/08/2012   CHOLHDL 3.0 12/08/2012   No results found for: HGBA1C No results found for: VITAMINB12 Lab Results  Component Value Date   TSH 1.626 12/08/2012       ASSESSMENT AND PLAN 34 y.o. year old female  has a past medical history of Asthma, Headache, Migraine, and Papilledema. here with ***    ICD-10-CM   1. IIH (idiopathic intracranial hypertension)  G93.2        No orders of the defined types were placed in this encounter.    No orders of the defined types were placed in this encounter.     I spent 15 minutes with the patient. 50% of this time was spent counseling and educating patient on plan of care and medications.    Shawnie Dappermy Raydell Maners, FNP-C 10/26/2019, 1:48 PM [facility] [address], [address] [place], [place] [address] [phone]

## 2019-10-27 ENCOUNTER — Ambulatory Visit: Payer: BC Managed Care – PPO | Admitting: Family Medicine

## 2019-10-27 ENCOUNTER — Encounter: Payer: Self-pay | Admitting: Family Medicine

## 2019-10-27 DIAGNOSIS — G932 Benign intracranial hypertension: Secondary | ICD-10-CM

## 2020-11-01 IMAGING — XA LUMBAR PUNCTURE FLUORO GUIDE
1 series · 1 of 1 positions shown · non-contrast
Comparison: none

CLINICAL DATA: Idiopathic intracranial hypertension visual changes
and headaches.

[Series 1: ortho standard · 1 of 1 slices shown]
[im 1/1]
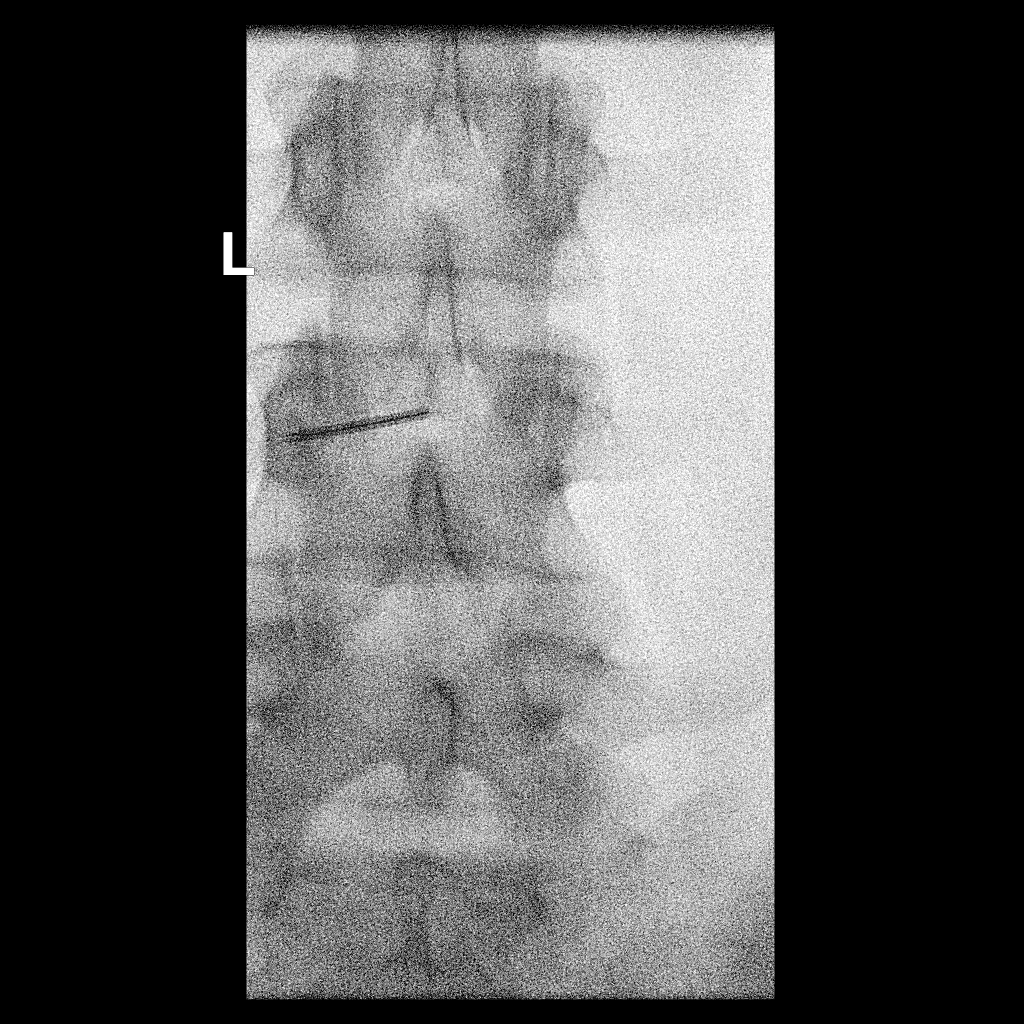

[1 of 1 positions shown; findings below may reference images not displayed]

EXAM:
DIAGNOSTIC LUMBAR PUNCTURE UNDER FLUOROSCOPIC GUIDANCE

FLUOROSCOPY TIME:  Fluoroscopy Time:  1 second

Radiation Exposure Index (if provided by the fluoroscopic device):
4.68 microGray*m^2

Number of Acquired Spot Images: 0

PROCEDURE:
Informed consent was obtained from the patient prior to the
procedure, including potential complications of headache, allergy,
and pain. With the patient prone, the lower back was prepped with
Betadine. 1% Lidocaine was used for local anesthesia. Lumbar
puncture was performed at the L3-4 level using a 3.5 inch 20 gauge
needle via a left interlaminar approach with return of clear CSF
with an opening pressure of 25 cm water (measured in the left
lateral decubitus position). 16 mL of CSF were obtained for
laboratory studies. Closing pressure was 10 cm water. The patient
tolerated the procedure well and there were no apparent
complications.
IMPRESSION: Successful fluoroscopically guided lumbar puncture. Elevated opening
pressure of 25 cm water.

## 2021-08-15 DIAGNOSIS — R636 Underweight: Secondary | ICD-10-CM | POA: Diagnosis not present

## 2021-08-15 DIAGNOSIS — Z681 Body mass index (BMI) 19 or less, adult: Secondary | ICD-10-CM | POA: Diagnosis not present

## 2021-08-15 DIAGNOSIS — K5900 Constipation, unspecified: Secondary | ICD-10-CM | POA: Diagnosis not present

## 2021-08-15 DIAGNOSIS — Z0000 Encounter for general adult medical examination without abnormal findings: Secondary | ICD-10-CM | POA: Diagnosis not present

## 2021-08-15 DIAGNOSIS — Z13220 Encounter for screening for lipoid disorders: Secondary | ICD-10-CM | POA: Diagnosis not present

## 2021-09-12 DIAGNOSIS — Z01419 Encounter for gynecological examination (general) (routine) without abnormal findings: Secondary | ICD-10-CM | POA: Diagnosis not present

## 2021-09-12 DIAGNOSIS — N926 Irregular menstruation, unspecified: Secondary | ICD-10-CM | POA: Diagnosis not present

## 2021-09-12 DIAGNOSIS — R636 Underweight: Secondary | ICD-10-CM | POA: Diagnosis not present

## 2021-09-12 DIAGNOSIS — Z681 Body mass index (BMI) 19 or less, adult: Secondary | ICD-10-CM | POA: Diagnosis not present

## 2022-05-22 DIAGNOSIS — D485 Neoplasm of uncertain behavior of skin: Secondary | ICD-10-CM | POA: Diagnosis not present

## 2022-05-22 DIAGNOSIS — L814 Other melanin hyperpigmentation: Secondary | ICD-10-CM | POA: Diagnosis not present

## 2022-05-22 DIAGNOSIS — D225 Melanocytic nevi of trunk: Secondary | ICD-10-CM | POA: Diagnosis not present

## 2023-06-09 DIAGNOSIS — R062 Wheezing: Secondary | ICD-10-CM | POA: Diagnosis not present

## 2023-08-05 DIAGNOSIS — R062 Wheezing: Secondary | ICD-10-CM | POA: Diagnosis not present

## 2023-08-05 DIAGNOSIS — R059 Cough, unspecified: Secondary | ICD-10-CM | POA: Diagnosis not present

## 2024-01-12 ENCOUNTER — Encounter: Payer: Self-pay | Admitting: Internal Medicine

## 2024-01-12 ENCOUNTER — Ambulatory Visit: Admitting: Internal Medicine

## 2024-01-12 VITALS — BP 137/88 | HR 63 | Temp 97.6°F | Ht 63.0 in | Wt 165.0 lb

## 2024-01-12 DIAGNOSIS — K219 Gastro-esophageal reflux disease without esophagitis: Secondary | ICD-10-CM

## 2024-01-12 DIAGNOSIS — J4540 Moderate persistent asthma, uncomplicated: Secondary | ICD-10-CM

## 2024-01-12 DIAGNOSIS — J309 Allergic rhinitis, unspecified: Secondary | ICD-10-CM | POA: Diagnosis not present

## 2024-01-12 LAB — POCT EXHALED NITRIC OXIDE: FeNO level (ppb): 51

## 2024-01-12 MED ORDER — FLUTICASONE-SALMETEROL 500-50 MCG/ACT IN AEPB: 1.0000 | INHALATION_SPRAY | Freq: Two times a day (BID) | RESPIRATORY_TRACT | 5 refills | Status: DC

## 2024-01-12 MED ORDER — ALBUTEROL SULFATE HFA 108 (90 BASE) MCG/ACT IN AERS: 2.0000 | INHALATION_SPRAY | Freq: Four times a day (QID) | RESPIRATORY_TRACT | 6 refills | Status: AC | PRN

## 2024-01-12 NOTE — Patient Instructions (Addendum)
 It was a pleasure to see you today!  Please schedule follow up with myself in 4 weeks.  If my schedule is not open yet, we will contact you with a reminder closer to that time. Please call [phone] if you haven't heard from us  a month before, and always call us  sooner if issues or concerns arise. You can also send us  a message through MyChart, but but aware that this is not to be used for urgent issues and it may take up to 5-7 days to receive a reply. Please be aware that you will likely be able to view your results before I have a chance to respond to them. Please give us  5 business days to respond to any non-urgent results.     VISIT SUMMARY: You came in today due to worsening shortness of breath and asthma symptoms. Your asthma has been getting worse since April 2025, and you have been experiencing wheezing, chest tightness, and a persistent cough. You also started having reflux last night. We discussed your history of asthma, previous treatments, and potential triggers. We also reviewed your sinus issues and reflux symptoms.  YOUR PLAN: -MODERATE PERSISTENT ASTHMA: Moderate persistent asthma means your asthma symptoms are more frequent and can affect your daily activities. We have started you on a high-dose Wixela inhaler, to be used one puff in the morning and one at night, and you should gargle after each use. Your albuterol inhaler has been refilled for rescue use. We will perform exhaled nitric oxide  test to assess your lung function and provided education on proper inhaler technique and asthma management. Please follow up in four weeks to see how you are responding to the treatment.  -ALLERGIC RHINITIS: Allergic rhinitis is inflammation of the inside of your nose caused by an allergen, such as pollen, dust, or pet dander. It can contribute to your asthma symptoms. Continue taking Claritin as you have been. If your sinus issues persist after we get your asthma under control, we may  consider referring you to an ENT specialist.  -GASTROESOPHAGEAL REFLUX DISEASE (GERD): GERD is a condition where stomach acid frequently flows back into the tube connecting your mouth and stomach, causing irritation. This can worsen asthma symptoms. Continue taking Tums for relief. If your asthma does not improve, we will assess the need for further intervention for your reflux.  INSTRUCTIONS: Please follow up in four weeks to assess your response to the new asthma treatment. We will also perform spirometry to check your lung function. If your sinus issues persist after we get your asthma under control, we may consider referring you to an ENT specialist.

## 2024-01-12 NOTE — Progress Notes (Signed)
The patient has been prescribed the inhaler wixela. Inhaler technique was demonstrated to patient. The patient subsequently demonstrated correct technique.  

## 2024-01-12 NOTE — Progress Notes (Signed)
 Christina Harmon    [id number]    [birth year]  Primary Care Physician:No primary care provider on file.  Referring Physician: No referring provider defined for this encounter. Reason for Consultation: asthma Date of Consultation: 01/12/2024  Chief complaint:   Chief Complaint  Patient presents with   Asthma    Asthma not under control     HPI: Discussed the use of AI scribe software for clinical note transcription with the patient, who gave verbal consent to proceed.  History of Present Illness   Christina Harmon is a 38 year old female with asthma who presents with worsening shortness of breath and asthma symptoms. She is accompanied by her husband, Gordy. She was referred by her previous doctor after a suspicion of pneumonia and a recommendation to see a specialist.  Diagnosed with asthma in 2016, she had been symptom-free for two years following significant weight loss in 2022. Since April 2025, she has experienced worsening shortness of breath. Initially treated with prednisone  and a Z-Pak at urgent care, her symptoms persisted. In June 2025, after a chest x-ray, she was referred to a specialist.  Her asthma symptoms have progressively worsened, causing significant limitations in daily activities, such as difficulty showering and exhaustion after work. She experiences wheezing, chest tightness, and a persistent cough for the past three weeks. Reflux began last night and has been constant.  She uses a rescue inhaler (albuterol) every four hours and Wixela once daily. Previously, she used out-of-date inhalers and nebulizers. Triggers include certain spices, black pepper, nut shells, and strong scents. No hospitalizations for asthma.  She has a history of bronchitis in 2014-2015 and was diagnosed with idiopathic intracranial hypertension (IIH), which improved with weight loss. She experiences seasonal allergies and takes Claritin. She previously took Singulair  but is not  currently on it.  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 She has a history of living with chain-smoking parents but has never smoked herself. Her grandmother on her mother's side has COPD.  She reports terrible sinus issues and had a sinus CT scan eight years ago showing thickening but no polyps. She wakes frequently at night to use the bathroom and has a history of snoring.       Current Regimen: albuterol takes every 4 hours.  Asthma Triggers: exertion, spices, strong smells and scents Exacerbations in the last year: two History of hospitalization or intubation: never Allergy Testing/rhinitis: has been on singulair  in the past.  GERD: yes on tums as needed ACT:  Asthma Control Test ACT Total Score  01/12/2024  3:38 PM 6   FeNO: Serum Eos/IgE:   Social history:  Occupation: astronomer at [employer] Exposures: lives at home with husband, kids, has a dog x 5 years Smoking history: never smoker, passive smoke exposure in childhood  Social History   Occupational History   Not on file  Tobacco Use   Smoking status: Never   Smokeless tobacco: Never  Vaping Use   Vaping status: Never Used  Substance and Sexual Activity   Alcohol use: Never   Drug use: Never   Sexual activity: Not on file    Relevant family history:  Family History  Problem Relation Age of Onset   Depression Mother    Hypothyroidism Mother    COPD Mother    Hypertension Father    COPD Maternal Grandmother    COPD Maternal Grandfather  Stroke Maternal Grandfather        mini strokes   Stroke Paternal Grandmother        mini strokes   Brain cancer Paternal Grandfather    Cancer Other        leukemia--maternal great aunt   Pseudotumor cerebri Neg Hx    Asthma Neg Hx     Past Medical History:  Diagnosis Date   Asthma    Headache    Migraine    Papilledema     Past Surgical History:  Procedure Laterality Date    ANKLE SURGERY  2002   cyst removed from right ankle     Physical Exam: Blood pressure 137/88, pulse 63, temperature 97.6 F (36.4 C), temperature source Oral, height 5' 3 (1.6 m), weight 165 lb (74.8 kg), SpO2 93%. Gen:      No acute distress ENT:  +cobblestoning in oropharynx, small oral airway, no nasal polyps, mucus membranes moist Lungs:    No increased respiratory effort, symmetric chest wall excursion, clear to auscultation bilaterally, no wheezes or crackles CV:         Regular rate and rhythm; no murmurs, rubs, or gallops.  No pedal edema Abd:      + bowel sounds; soft, non-tender; no distension MSK: no acute synovitis of DIP or PIP joints, no mechanics hands.  Skin:      Warm and dry; no rashes Neuro: normal speech, no focal facial asymmetry, anxious affect Psych: alert and oriented x3, normal mood and affect   Data Reviewed/Medical Decision Making:  Independent interpretation of tests: Imaging:  PFTs: I have personally reviewed the patient's PFTs and normal spirometry in 2018    Latest Ref Rng & Units 06/26/2016    3:13 PM  PFT Results  FVC-Pre L 3.81   FVC-Predicted Pre % 101   Pre FEV1/FVC % % 77   FEV1-Pre L 2.92   FEV1-Predicted Pre % 92   DLCO uncorrected ml/min/mmHg 27.30   DLCO UNC% % 114   DLCO corrected ml/min/mmHg 29.04   DLCO COR %Predicted % 121   DLVA Predicted % 116     Labs:  Lab Results  Component Value Date   NA 141 05/27/2018   K 4.2 05/27/2018   CO2 23 05/27/2018   GLUCOSE 92 05/27/2018   BUN 9 05/27/2018   CREATININE 0.72 05/27/2018   CALCIUM  9.4 05/27/2018   GFRNONAA 111 05/27/2018   Lab Results  Component Value Date   WBC 7.6 05/27/2018   HGB 13.3 05/27/2018   HCT 39.0 05/27/2018   MCV 86 05/27/2018   PLT 362 05/27/2018   Absolute eosinophil count 1800 in 2018 Total ige 35 in 2018   Immunization status:  Immunization History  Administered Date(s) Administered   HPV Quadrivalent 10/22/2009, 12/19/2009, 11/18/2010    Influenza Whole 11/18/2010   Influenza,inj,Quad PF,6+ Mos 12/08/2012   Td 09/24/2009   Assessment and Plan    Moderate persistent asthma Asthma symptoms worsened with increased dyspnea, wheezing, and cough. Exacerbated by exertion, spices, and environmental factors. Previously controlled with lifestyle changes. Current use of outdated inhalers. Differential includes vocal cord dysfunction. - Initiated high-dose Wixela inhaler 500, one puff morning and night, gargle after use. - Refilled albuterol inhaler for rescue use. - Performed feno today - elevated at 51 ppb consistent with allergic inflammation - Provided education on inhaler technique and asthma management. - Follow up in four weeks to assess response.  Allergic rhinitis Sinus drainage and possible allergy-related sinus issues. Takes Claritin.  Sinus CT showed thickening. Allergic rhinitis may contribute to asthma exacerbations. - Continue Claritin. - Consider ENT referral if sinus issues persist after asthma control.  Gastroesophageal reflux disease (GERD) Reflux symptoms with burning sensation. GERD may contribute to asthma exacerbations through micro aspiration and inflammation. - Continue Tums for symptomatic relief. - Assess need for further intervention if asthma control does not improve.       Return to Care: Return in about 4 weeks (around 02/09/2024).  Verdon Gore, MD Pulmonary and Critical Care Medicine [facility] Office:[phone]  CC: No ref. provider found

## 2024-01-15 ENCOUNTER — Other Ambulatory Visit (HOSPITAL_COMMUNITY): Payer: Self-pay

## 2024-01-15 ENCOUNTER — Telehealth: Payer: Self-pay

## 2024-01-15 NOTE — Telephone Encounter (Signed)
 Please advise on preferred medications per patient insurance   Preferred medications are Advair HFA, Breo Ellipta and Symbicort 

## 2024-01-15 NOTE — Telephone Encounter (Signed)
 Copied from CRM [id number]. Topic: Clinical - Medication Prior Auth >> Jan 15, 2024  9:31 AM Russell PARAS wrote: Reason for CRM:   Pt is contacting clinic regarding Wixela that was prescribed by provider. She was advised by [facility] they are still waiting on PA for this med.   Requested call back with status update  CB#  [phone]

## 2024-01-15 NOTE — Telephone Encounter (Signed)
 PA needed wixela

## 2024-01-18 MED ORDER — FLUTICASONE-SALMETEROL 230-21 MCG/ACT IN AERO: 2.0000 | INHALATION_SPRAY | Freq: Two times a day (BID) | RESPIRATORY_TRACT | 5 refills | Status: AC

## 2024-01-18 NOTE — Telephone Encounter (Signed)
 Advair hfa sent

## 2024-02-10 ENCOUNTER — Ambulatory Visit: Admitting: Internal Medicine

## 2024-02-10 ENCOUNTER — Encounter: Payer: Self-pay | Admitting: Internal Medicine

## 2024-02-10 VITALS — BP 96/76 | HR 76 | Temp 97.9°F | Ht 63.0 in | Wt 111.2 lb

## 2024-02-10 DIAGNOSIS — K219 Gastro-esophageal reflux disease without esophagitis: Secondary | ICD-10-CM | POA: Diagnosis not present

## 2024-02-10 DIAGNOSIS — J4540 Moderate persistent asthma, uncomplicated: Secondary | ICD-10-CM

## 2024-02-10 DIAGNOSIS — R0982 Postnasal drip: Secondary | ICD-10-CM | POA: Diagnosis not present

## 2024-02-10 DIAGNOSIS — J4530 Mild persistent asthma, uncomplicated: Secondary | ICD-10-CM

## 2024-02-10 MED ORDER — AZELASTINE HCL 0.1 % NA SOLN: 1.0000 | Freq: Two times a day (BID) | NASAL | 12 refills | Status: AC

## 2024-02-10 NOTE — Progress Notes (Signed)
 Christina Harmon    [id number]    15-Apr-1985  Primary Care Physician:No primary care provider on file. Date of Appointment: 02/10/2024 Established Patient Visit  Chief complaint:   Chief Complaint  Patient presents with   Asthma    Breathing is better.  Has a dry cough, at times gets up some grayish mucous, hard to cough up.     HPI: Christina Harmon is a 38 y.o. woman with moderate persistent asthma, gerd and chronic rhinitis.   Interval Updates: She feels much better in terms of her asthma once starting the wixela. Insurance covers advair 115 2 puffs bid  Has cough with minimal provocation. Worse with laughing. Occasionally coughs up black specks  Worse with talking for long periods of time.  She has frequent throat clearing Doesn't wake her up at night.  Overall cough has improved since starting wixela.  Minimal albuterol  use  Current Regimen: wixela 500 1 puff bid albuterol  prn Asthma Triggers: exertion, spices, strong smells and scents Exacerbations in the last year: two History of hospitalization or intubation: never Allergy Testing/rhinitis: yes on claritin GERD: tums prn ACT:  Asthma Control Test ACT Total Score  01/12/2024  3:38 PM 6   FeNO: 51 ppb Serum Eos/IgE:    Past Medical History:  Diagnosis Date   Asthma    Headache    Migraine    Papilledema     Past Surgical History:  Procedure Laterality Date   ANKLE SURGERY  2002   cyst removed from right ankle    Family History  Problem Relation Age of Onset   Depression Mother    Hypothyroidism Mother    COPD Mother    Hypertension Father    COPD Maternal Grandmother    COPD Maternal Grandfather    Stroke Maternal Grandfather        mini strokes   Stroke Paternal Grandmother        mini strokes   Brain cancer Paternal Grandfather    Cancer Other        leukemia--maternal great aunt   Pseudotumor cerebri Neg Hx    Asthma Neg Hx     Social History   Occupational History    Not on file  Tobacco Use   Smoking status: Never   Smokeless tobacco: Never  Vaping Use   Vaping status: Never Used  Substance and Sexual Activity   Alcohol use: Never   Drug use: Never   Sexual activity: Not on file     Physical Exam: Blood pressure 96/76, pulse 76, temperature 97.9 F (36.6 C), temperature source Oral, height 5' 3 (1.6 m), weight 111 lb 3.2 oz (50.4 kg), SpO2 94%.  Gen:      No acute distress ENT:  +cobblestoning, mild nasal debris, no nasal polyps, mucus membranes moist Lungs:    No increased respiratory effort, symmetric chest wall excursion, clear to auscultation bilaterally, no wheezes or crackles CV:         Regular rate and rhythm; no murmurs, rubs, or gallops.  No pedal edema   Data Reviewed: Imaging: I have personally reviewed the   PFTs:     Latest Ref Rng & Units 06/26/2016    3:13 PM  PFT Results  FVC-Pre L 3.81   FVC-Predicted Pre % 101   Pre FEV1/FVC % % 77   FEV1-Pre L 2.92   FEV1-Predicted Pre % 92   DLCO uncorrected ml/min/mmHg 27.30   DLCO UNC% %  114   DLCO corrected ml/min/mmHg 29.04   DLCO COR %Predicted % 121   DLVA Predicted % 116    I have personally reviewed the patient's PFTs and normal pft  Labs:  Immunization status: Immunization History  Administered Date(s) Administered   HPV Quadrivalent 10/22/2009, 12/19/2009, 11/18/2010   Influenza Whole 11/18/2010   Influenza,inj,Quad PF,6+ Mos 12/08/2012   Td 09/24/2009     Assessment:  Mild persistent asthma Post nasal drainage GERD, not well controlled  Plan/Recommendations:  Stop claritin and switch to xyzal 1 pill once daily.   Continue the flonase  nasal spray. We are adding astelin nasal spray.   Continue the advair 2 puffs in the morning, 2 puffs at night, gargle after use.  Continue albuterol  as needed.   Continue tums for reflux. If after 2-3 weeks of managing the nasal drainage you are not improving with cough, would add an over the counter acid reflux  medicine like omeprazole or similar.     Return to Care: Return in about 2 months (around 04/12/2024).   Verdon Gore, MD Pulmonary and Critical Care Medicine [facility] [phone]

## 2024-02-10 NOTE — Patient Instructions (Addendum)
 It was a pleasure to see you today!  Please schedule follow up with Tammy or Beth in 2 months. Please call sooner [phone] if issues or concerns arise. You can also send us  a message through MyChart, but but aware that this is not to be used for urgent issues and it may take up to 5-7 days to receive a reply. Please be aware that you will likely be able to view your results before I have a chance to respond to them. Please give us  5 business days to respond to any non-urgent results.    Stop claritin and switch to xyzal 1 pill once daily.   Continue the flonase  nasal spray. We are adding astelin nasal spray.   Continue the advair 2 puffs in the morning, 2 puffs at night, gargle after use.  Continue albuterol  as needed.   Continue tums for reflux. If after 2-3 weeks of managing the nasal drainage you are not improving with cough, would add an over the counter acid reflux medicine like omeprazole or similar.   Avoid clearing your throat, and try to sip on some warm liquids and use honey as a natural cough suppressant.    What is GERD? Gastroesophageal reflux disease (GERD) is gastroesophageal reflux diseasewhich occurs when the lower esophageal sphincter (LES) opens spontaneously, for varying periods of time, or does not close properly and stomach contents rise up into the esophagus. GER is also called acid reflux or acid regurgitation, because digestive juices--called acids--rise up with the food. The esophagus is the tube that carries food from the mouth to the stomach. The LES is a ring of muscle at the bottom of the esophagus that acts like a valve between the esophagus and stomach.  When acid reflux occurs, food or fluid can be tasted in the back of the mouth. When refluxed stomach acid touches the lining of the esophagus it may cause a burning sensation in the chest or throat called heartburn or acid indigestion. Occasional reflux is common. Persistent reflux that occurs more than twice  a week is considered GERD, and it can eventually lead to more serious health problems. People of all ages can have GERD. Studies have shown that GERD may worsen or contribute to asthma, chronic cough, and pulmonary fibrosis.   What are the symptoms of GERD? The main symptom of GERD in adults is frequent heartburn, also called acid indigestion--burning-type pain in the lower part of the mid-chest, behind the breast bone, and in the mid-abdomen.  Not all reflux is acidic in nature, and many patients don't have heart burn at all. Sometimes it feels like a cough (either dry or with mucus), choking sensation, asthma, shortness of breath, waking up at night, frequent throat clearing, or trouble swallowing.    What causes GERD? The reason some people develop GERD is still unclear. However, research shows that in people with GERD, the LES relaxes while the rest of the esophagus is working. Anatomical abnormalities such as a hiatal hernia may also contribute to GERD. A hiatal hernia occurs when the upper part of the stomach and the LES move above the diaphragm, the muscle wall that separates the stomach from the chest. Normally, the diaphragm helps the LES keep acid from rising up into the esophagus. When a hiatal hernia is present, acid reflux can occur more easily. A hiatal hernia can occur in people of any age and is most often a normal finding in otherwise healthy people over age 4. Most of the time,  a hiatal hernia produces no symptoms.   Other factors that may contribute to GERD include - Obesity or recent weight gain - Pregnancy  - Smoking  - Diet - Certain medications  Common foods that can worsen reflux symptoms include: - carbonated beverages - artificial sweeteners - citrus fruits  - chocolate  - drinks with caffeine or alcohol  - fatty and fried foods  - garlic and onions  - mint flavorings  - spicy foods  - tomato-based foods, like spaghetti sauce, salsa, chili, and pizza    Lifestyle Changes If you smoke, stop.  Avoid foods and beverages that worsen symptoms (see above.) Lose weight if needed.  Eat small, frequent meals.  Wear loose-fitting clothes.  Avoid lying down for 3 hours after a meal.  Raise the head of your bed 6 to 8 inches by securing wood blocks under the bedposts. Just using extra pillows will not help, but using a wedge-shaped pillow may be helpful.  Medications  H2 blockers, such as cimetidine (Tagamet HB), famotidine (Pepcid AC), nizatidine (Axid AR), and ranitidine (Zantac 75), decrease acid production. They are available in prescription strength and over-the-counter strength. These drugs provide short-term relief and are effective for about half of those who have GERD symptoms.  Proton pump inhibitors include omeprazole (Prilosec, Zegerid), lansoprazole (Prevacid), pantoprazole  (Protonix ), rabeprazole (Aciphex), and esomeprazole (Nexium), which are available by prescription. Prilosec is also available in over-the-counter strength. Proton pump inhibitors are more effective than H2 blockers and can relieve symptoms and heal the esophageal lining in almost everyone who has GERD.  Because drugs work in different ways, combinations of medications may help control symptoms. People who get heartburn after eating may take both antacids and H2 blockers. The antacids work first to neutralize the acid in the stomach, and then the H2 blockers act on acid production. By the time the antacid stops working, the H2 blocker will have stopped acid production. Your health care provider is the best source of information about how to use medications for GERD.   Points to Remember 1. You can have GERD without having heartburn. Your symptoms could include a dry cough, asthma symptoms, or trouble swallowing.  2. Taking medications daily as prescribed is important in controlling you symptoms.  Sometimes it can take up to 8 weeks to fully achieve the effects of the  medications prescribed.  3. Coughing related to GERD can be difficult to treat and is very frustrating!  However, it is important to stick with these medications and lifestyle modifications before pursuing more aggressive or invasive test and treatments.

## 2024-04-14 ENCOUNTER — Encounter: Admitting: Pulmonary Disease

## 2024-05-13 ENCOUNTER — Encounter: Admitting: Pulmonary Disease
